# Patient Record
Sex: Male | Born: 1980 | Race: Black or African American | Hispanic: No | Marital: Single | Smoking: Never smoker
Health system: Southern US, Community
[De-identification: ages and names within clinical notes are randomized; demographics above are authoritative.]

## PROBLEM LIST (undated history)

## (undated) DIAGNOSIS — F32A Depression, unspecified: Secondary | ICD-10-CM

## (undated) DIAGNOSIS — J45909 Unspecified asthma, uncomplicated: Secondary | ICD-10-CM

## (undated) DIAGNOSIS — F329 Major depressive disorder, single episode, unspecified: Secondary | ICD-10-CM

---
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
## 2000-06-24 ENCOUNTER — Emergency Department (HOSPITAL_COMMUNITY): Admission: EM | Admit: 2000-06-24 | Discharge: 2000-06-24 | Payer: Self-pay

## 2001-06-09 ENCOUNTER — Emergency Department (HOSPITAL_COMMUNITY): Admission: EM | Admit: 2001-06-09 | Discharge: 2001-06-09 | Payer: Self-pay | Admitting: Emergency Medicine

## 2001-06-09 ENCOUNTER — Encounter: Payer: Self-pay | Admitting: Internal Medicine

## 2001-06-25 ENCOUNTER — Emergency Department (HOSPITAL_COMMUNITY): Admission: EM | Admit: 2001-06-25 | Discharge: 2001-06-25 | Payer: Self-pay | Admitting: Emergency Medicine

## 2001-10-03 ENCOUNTER — Encounter: Payer: Self-pay | Admitting: Emergency Medicine

## 2001-10-03 ENCOUNTER — Encounter (INDEPENDENT_AMBULATORY_CARE_PROVIDER_SITE_OTHER): Payer: Self-pay | Admitting: Specialist

## 2001-10-04 ENCOUNTER — Observation Stay (HOSPITAL_COMMUNITY): Admission: EM | Admit: 2001-10-04 | Discharge: 2001-10-04 | Payer: Self-pay | Admitting: Emergency Medicine

## 2006-05-18 ENCOUNTER — Encounter: Admission: RE | Admit: 2006-05-18 | Discharge: 2006-05-18 | Payer: Self-pay | Admitting: *Deleted

## 2006-07-04 ENCOUNTER — Emergency Department (HOSPITAL_COMMUNITY): Admission: EM | Admit: 2006-07-04 | Discharge: 2006-07-04 | Payer: Self-pay | Admitting: *Deleted

## 2006-10-20 ENCOUNTER — Emergency Department (HOSPITAL_COMMUNITY): Admission: EM | Admit: 2006-10-20 | Discharge: 2006-10-20 | Payer: Self-pay | Admitting: Emergency Medicine

## 2007-07-26 ENCOUNTER — Encounter: Admission: RE | Admit: 2007-07-26 | Discharge: 2007-08-15 | Payer: Self-pay | Admitting: Sports Medicine

## 2007-10-24 ENCOUNTER — Emergency Department (HOSPITAL_COMMUNITY): Admission: EM | Admit: 2007-10-24 | Discharge: 2007-10-24 | Payer: Self-pay | Admitting: Emergency Medicine

## 2008-02-22 ENCOUNTER — Encounter: Admission: RE | Admit: 2008-02-22 | Discharge: 2008-02-22 | Payer: Self-pay | Admitting: Orthopedic Surgery

## 2008-12-23 ENCOUNTER — Emergency Department (HOSPITAL_COMMUNITY): Admission: EM | Admit: 2008-12-23 | Discharge: 2008-12-23 | Payer: Self-pay | Admitting: Emergency Medicine

## 2009-04-05 ENCOUNTER — Emergency Department (HOSPITAL_COMMUNITY): Admission: EM | Admit: 2009-04-05 | Discharge: 2009-04-05 | Payer: Self-pay | Admitting: Emergency Medicine

## 2009-05-06 ENCOUNTER — Encounter: Payer: Self-pay | Admitting: Internal Medicine

## 2010-04-17 ENCOUNTER — Emergency Department (HOSPITAL_COMMUNITY): Admission: EM | Admit: 2010-04-17 | Discharge: 2010-04-17 | Payer: Self-pay | Admitting: Emergency Medicine

## 2011-03-15 LAB — GC/CHLAMYDIA PROBE AMP, GENITAL
Chlamydia, DNA Probe: NEGATIVE
GC Probe Amp, Genital: NEGATIVE

## 2011-03-15 LAB — HIV ANTIBODY (ROUTINE TESTING W REFLEX): HIV: NONREACTIVE

## 2011-03-15 LAB — RPR: RPR Ser Ql: NONREACTIVE

## 2011-04-16 NOTE — Op Note (Signed)
[facility]  Patient:    Brady Wells, Brady Wells Visit Number: [id number] MRN: [id number]          Service Type: MED Location: [phone] Attending Physician:  Meredith Leeds Dictated by:   Zigmund Daniel, M.D. Proc. Date: 10/04/01 Admit Date:  10/03/2001 Discharge Date: 10/04/2001                             Operative Report  PREOPERATIVE DIAGNOSIS:  Acute appendicitis.  POSTOPERATIVE DIAGNOSIS:  Acute appendicitis.  OPERATION:  Laparoscopic appendectomy.  SURGEON:  Zigmund Daniel, M.D.  ANESTHESIA:  General.  DESCRIPTION OF PROCEDURE:  After the patient had adequate monitoring and general anesthesia, and routine preparation and draping of the abdomen, I made a short transverse infraumbilical incision after anesthetizing the skin, subcutaneous tissue, and deeper tissues.  I opened the fascia longitudinally, and the peritoneum bluntly, and put in a 0 Vicryl pursestring suture in the fascia, secured a Hasson cannula, inflated the abdomen with CO2, and performed laparoscopy.  The patient had obvious acute appendicitis.  I put in a 5 mm right upper quadrant port and a 12 mm suprapubic port after anesthetizing the port sites, putting them in under direct vision to avoid bowel injury.  I then grasped the appendix and elevated it, and mobilized the mesoappendix.  Near the cecum, the appendix was healthy.  I stapled across the appendix and mesoappendix with a single firing of the endovascular stapler, and that produced good hemostasis, and amputated the appendix nicely.  I removed it from the body in a plastic pouch through the umbilical incision and then tied the pursestring suture.  I checked for hemostasis and found it to be good. There was no free fluid requiring removal.  I removed the right upper quadrant port under direct vision and then released the CO2 and removed the suprapubic port.  There was no evident bleeding.  I closed the skin  of all three incisions with intracuticular 4-0 Vicryl and Steri-Strips.  The patient tolerated the operation well. Dictated by:   Zigmund Daniel, M.D. Attending Physician:  Meredith Leeds DD:  10/04/01 TD:  10/05/01 Job: [id number]

## 2011-09-07 LAB — POCT RAPID STREP A: Streptococcus, Group A Screen (Direct): NEGATIVE

## 2011-09-07 LAB — POCT INFECTIOUS MONO SCREEN: Mono Screen: NEGATIVE

## 2011-12-10 ENCOUNTER — Ambulatory Visit (INDEPENDENT_AMBULATORY_CARE_PROVIDER_SITE_OTHER): Payer: BC Managed Care – PPO

## 2011-12-10 DIAGNOSIS — S63509A Unspecified sprain of unspecified wrist, initial encounter: Secondary | ICD-10-CM

## 2011-12-10 DIAGNOSIS — M79609 Pain in unspecified limb: Secondary | ICD-10-CM

## 2012-04-03 ENCOUNTER — Ambulatory Visit (INDEPENDENT_AMBULATORY_CARE_PROVIDER_SITE_OTHER): Payer: BC Managed Care – PPO | Admitting: Internal Medicine

## 2012-04-03 VITALS — BP 123/74 | HR 67 | Temp 98.4°F | Resp 16 | Ht 71.5 in | Wt 195.0 lb

## 2012-04-03 DIAGNOSIS — S8010XA Contusion of unspecified lower leg, initial encounter: Secondary | ICD-10-CM

## 2012-04-03 NOTE — Progress Notes (Signed)
  Subjective:    Patient ID: Brady Wells, male    DOB: [birth year], 31 y.o.   MRN: [id number]  HPI3 days ago he dropped the heavy end of a trailer and it slid down his leg striking him in the posterior calf-he has swelling with bruising and pain with walking ever since   Works 2 jobs one sitting and 1 [employer] so they sent him home from [employer] Review of Systems     Objective:   Physical ExamThe right calf has ecchymoses from the mid calf into the edge of the Achilles and gathered around the heel/he is now swollen and the lower one third of the calf No muscle defect Good range of motion of the ankle and foot No mass deep in the calf Negative Homans Able to walk without pain but hurts to tip toe        Assessment & Plan:  Problem #1 contusion leg Use ACE or compression stocking to help control swelling Increased angulation as tolerated

## 2012-05-08 ENCOUNTER — Emergency Department (INDEPENDENT_AMBULATORY_CARE_PROVIDER_SITE_OTHER)
Admission: EM | Admit: 2012-05-08 | Discharge: 2012-05-08 | Disposition: A | Discharge status: Home / Self Care | Payer: BC Managed Care – PPO | Source: Home / Self Care

## 2012-05-08 ENCOUNTER — Encounter (HOSPITAL_COMMUNITY): Payer: Self-pay

## 2012-05-08 DIAGNOSIS — J309 Allergic rhinitis, unspecified: Secondary | ICD-10-CM

## 2012-05-08 DIAGNOSIS — Z9109 Other allergy status, other than to drugs and biological substances: Secondary | ICD-10-CM

## 2012-05-08 MED ORDER — AMOXICILLIN 500 MG PO CAPS: 500.0000 mg | ORAL_CAPSULE | Freq: Three times a day (TID) | ORAL | Status: AC

## 2012-05-08 MED ORDER — CHLORPHENIRAMINE-PHENYLEPHRINE 4-10 MG PO TABS: 1.0000 | ORAL_TABLET | ORAL | Status: AC | PRN

## 2012-05-08 NOTE — ED Provider Notes (Signed)
History     CSN: [id number]  Arrival date & time 05/08/12  1115   First MD Initiated Contact with Patient 05/08/12 1201      No chief complaint on file.   (Consider location/radiation/quality/duration/timing/severity/associated sxs/prior treatment) HPI  Patient is 31 year old male who presents with progressively worsening right ear pain associated with nasal congestion, nasal drip, sinus tenderness, generalized headaches. This initially started one week prior to this visit, and has been getting progressively worse. Patient also reports subjective fevers and chills, no other associated symptoms. Patient also denies any specific aggravating or alleviating factors. He denies similar episodes in the past and reports history of extensive allergies to different environmentals factors. Patient also reports history of ear infections.  No past medical history on file.  No past surgical history on file.  No family history on file.  History  Substance Use Topics  . Smoking status: Never Smoker   . Smokeless tobacco: Not on file  . Alcohol Use: Not on file      Review of Systems  Constitutional: Denies diaphoresis, appetite change and fatigue.  HEENT: Denies photophobia, eye pain, redness, hearing loss, sore throat, mouth sores, trouble swallowing, neck pain, neck stiffness and tinnitus.   Respiratory: Denies SOB, DOE, cough, chest tightness,  and wheezing.   Cardiovascular: Denies chest pain, palpitations and leg swelling.  Gastrointestinal: Denies nausea, vomiting, abdominal pain, diarrhea, constipation, blood in stool and abdominal distention.  Genitourinary: Denies dysuria, urgency, frequency, hematuria, flank pain and difficulty urinating.  Musculoskeletal: Denies myalgias, back pain, joint swelling, arthralgias and gait problem.  Skin: Denies pallor, rash and wound.  Neurological: Denies dizziness, seizures, syncope, weakness, light-headedness, numbness and headaches.    Hematological: Denies adenopathy. Easy bruising, personal or family bleeding history  Psychiatric/Behavioral: Denies suicidal ideation, mood changes, confusion, nervousness, sleep disturbance and agitation   Allergies  Review of patient's allergies indicates no known allergies.  Home Medications  No current outpatient prescriptions on file.  There were no vitals taken for this visit.  Physical Exam  Constitutional: Vital signs reviewed.  Patient is a well-developed and well-nourished in no acute distress and cooperative with exam. Alert and oriented x3.  Head: Normocephalic and atraumatic, maxillary and frontal sinus tenderness bilaterally Ear: TM normal left, right ear tympanic membrane erythematous and slightly swollen, small amount of blood noted Mouth: no erythema or exudates, MMM Eyes: PERRL, EOMI, conjunctivae normal, No scleral icterus.  Neck: Supple, Trachea midline normal ROM, No JVD, mass, thyromegaly, or carotid bruit present.  Cardiovascular: RRR, S1 normal, S2 normal, no MRG, pulses symmetric and intact bilaterally Pulmonary/Chest: CTAB, no wheezes, rales, or rhonchi Abdominal: Soft. Non-tender, non-distended, bowel sounds are normal, no masses, organomegaly, or guarding present.  Hematology: no cervical, inginal, or axillary adenopathy.   ED Course  Procedures (including critical care time)   Right ear infection  - Complicated by environmental allergy - Will prescribe course of antibiotic for patient and I have advised avoiding use of Q-tips - I also advised patient to see primary care physician if the symptoms do not improve over the next 48-72 hours   MDM  Right ear infection and allergies, needs antibiotic        Dorothea Ogle, MD 05/08/12 1253

## 2012-05-08 NOTE — Discharge Instructions (Signed)
Allergic Reaction  Allergic reactions can be caused by anything your body is sensitive to. Your body may be sensitive to food, medicines, molds, pollens, cockroaches, dust mites, pets, insect stings, and other things around you. An allergic reaction may cause puffiness (swelling), itching, sneezing, coughing, or problems breathing.   Allergies cannot be cured, but they can be controlled with medicine. Some allergies happen only at certain times of the year. Try to stay away from what causes your reaction if possible. Sometimes, it is hard to tell what causes your reaction.  HOME CARE  If you have a rash or red patches (hives) on your skin:   Take medicines as told by your doctor.   Do not drive or drink alcohol after taking medicines. They can make you sleepy.   Put cold cloths on your skin. Take baths in cool water. This will help your itching. Do not take hot baths or showers. Heat will make the itching worse.   If your allergies get worse, your doctor might give you other medicines. Talk to your doctor if problems continue.  GET HELP RIGHT AWAY IF:    You have trouble breathing.   You have a tight feeling in your chest or throat.   Your mouth gets puffy (swollen).   You have red, itchy patches on your skin (hives) that get worse.   You have itching all over your body.  MAKE SURE YOU:    Understand these instructions.   Will watch your condition.   Will get help right away if you are not doing well or get worse.  Document Released: 11/03/2009 Document Revised: 11/04/2011 Document Reviewed: 11/03/2009  [employer] Patient Information 2012 [employer]

## 2012-05-08 NOTE — ED Notes (Signed)
C/o allergy syx; NAD

## 2012-05-16 ENCOUNTER — Telehealth (HOSPITAL_COMMUNITY): Payer: Self-pay | Admitting: *Deleted

## 2012-05-16 NOTE — ED Notes (Signed)
Pt. called and said the antibiotic was making him sleepy and he could not function at work.  He was going to stop it. I told him he needs to finish the antibiotic to clear up the ear infection.  There is nothing in it that would make him sleepy.  It may be the Sudafed PE sinus/allergy.  I told him take it when he gets home from work and at bedtime, not before work.  Pt. said he would try that. Vassie Moselle 05/16/2012

## 2012-05-21 ENCOUNTER — Ambulatory Visit (INDEPENDENT_AMBULATORY_CARE_PROVIDER_SITE_OTHER): Payer: BC Managed Care – PPO | Admitting: Emergency Medicine

## 2012-05-21 VITALS — BP 115/72 | HR 71 | Temp 98.1°F | Resp 18 | Ht 71.5 in | Wt 186.0 lb

## 2012-05-21 DIAGNOSIS — J029 Acute pharyngitis, unspecified: Secondary | ICD-10-CM

## 2012-05-21 DIAGNOSIS — H1030 Unspecified acute conjunctivitis, unspecified eye: Secondary | ICD-10-CM

## 2012-05-21 DIAGNOSIS — J018 Other acute sinusitis: Secondary | ICD-10-CM

## 2012-05-21 DIAGNOSIS — J309 Allergic rhinitis, unspecified: Secondary | ICD-10-CM

## 2012-05-21 MED ORDER — AMOXICILLIN-POT CLAVULANATE 875-125 MG PO TABS: 1.0000 | ORAL_TABLET | Freq: Two times a day (BID) | ORAL | Status: AC

## 2012-05-21 MED ORDER — PSEUDOEPHEDRINE-GUAIFENESIN ER 120-1200 MG PO TB12: 1.0000 | ORAL_TABLET | Freq: Two times a day (BID) | ORAL | Status: DC

## 2012-05-21 NOTE — Progress Notes (Signed)
    Patient Name: Brady Wells Date of Birth: 1981-09-01 Medical Record Number: [id number] Gender: male Date of Encounter: 05/21/2012  History of Present Illness:  Brady Wells is a 31 y.o. very pleasant male patient who presents with the following:  Long history of allergy and sinusitis.  Seen twice before this week for same.  Has purulent discharge, nasal congestion and post nasal drip.  Bad taste in mouth.  Cough.  Not improving with nasal steroid sprays  There is no problem list on file for this patient.  No past medical history on file. No past surgical history on file. History  Substance Use Topics  . Smoking status: Never Smoker   . Smokeless tobacco: Not on file  . Alcohol Use: Not on file   No family history on file. No Known Allergies  Medication list has been reviewed and updated.  Prior to Admission medications   Medication Sig Start Date End Date Taking? Authorizing Provider  loratadine (CLARITIN) 10 MG tablet Take 10 mg by mouth daily.   Yes Historical Provider, MD  amoxicillin-clavulanate (AUGMENTIN) 875-125 MG per tablet Take 1 tablet by mouth 2 (two) times daily. 05/21/12 05/31/12  Phillips Odor, MD  Pseudoephedrine-Guaifenesin (MUCINEX D) [phone] MG TB12 Take 1 tablet by mouth 2 (two) times daily. 05/21/12   Phillips Odor, MD    Review of Systems:  As per HPI, otherwise negative.    Physical Examination: Filed Vitals:   05/21/12 1103  BP: 115/72  Pulse: 71  Temp: 98.1 F (36.7 C)  Resp: 18   Filed Vitals:   05/21/12 1103  Height: 5' 11.5" (1.816 m)  Weight: 186 lb (84.369 kg)   Body mass index is 25.58 kg/(m^2). Ideal Body Weight: Weight in (lb) to have BMI = 25: 181.4   GEN: WDWN, NAD, Non-toxic, A & O x 3 HEENT: Atraumatic, Normocephalic. Neck supple. No masses, No LAD.  Purulent drainage right eye Ears and Nose: No external deformity. CV: RRR, No M/G/R. No JVD. No thrill. No extra heart sounds. PULM: CTA B, no wheezes,  crackles, rhonchi. No retractions. No resp. distress. No accessory muscle use. ABD: S, NT, ND, +BS. No rebound. No HSM. EXTR: No c/c/e NEURO Normal gait.  PSYCH: Normally interactive. Conversant. Not depressed or anxious appearing.  Calm demeanor.    Assessment and Plan: Sinusitis Antibiotics Follow up as needed  Carmelina Dane, MD

## 2013-06-10 ENCOUNTER — Encounter (HOSPITAL_COMMUNITY): Payer: Self-pay | Admitting: *Deleted

## 2013-06-10 ENCOUNTER — Emergency Department (HOSPITAL_COMMUNITY): Payer: BC Managed Care – PPO

## 2013-06-10 ENCOUNTER — Emergency Department (HOSPITAL_COMMUNITY)
Admission: EM | Admit: 2013-06-10 | Discharge: 2013-06-11 | Disposition: A | Discharge status: Home / Self Care | Payer: BC Managed Care – PPO | Attending: Emergency Medicine | Admitting: Emergency Medicine

## 2013-06-10 DIAGNOSIS — Z79899 Other long term (current) drug therapy: Secondary | ICD-10-CM | POA: Insufficient documentation

## 2013-06-10 DIAGNOSIS — R55 Syncope and collapse: Secondary | ICD-10-CM | POA: Insufficient documentation

## 2013-06-10 DIAGNOSIS — F329 Major depressive disorder, single episode, unspecified: Secondary | ICD-10-CM | POA: Insufficient documentation

## 2013-06-10 DIAGNOSIS — F3289 Other specified depressive episodes: Secondary | ICD-10-CM | POA: Insufficient documentation

## 2013-06-10 DIAGNOSIS — G4720 Circadian rhythm sleep disorder, unspecified type: Secondary | ICD-10-CM | POA: Diagnosis present

## 2013-06-10 DIAGNOSIS — J45909 Unspecified asthma, uncomplicated: Secondary | ICD-10-CM | POA: Insufficient documentation

## 2013-06-10 HISTORY — DX: Unspecified asthma, uncomplicated: J45.909

## 2013-06-10 HISTORY — DX: Depression, unspecified: F32.A

## 2013-06-10 HISTORY — DX: Major depressive disorder, single episode, unspecified: F32.9

## 2013-06-10 LAB — CBC
HCT: 43.9 % (ref 39.0–52.0)
Hemoglobin: 14.5 g/dL (ref 13.0–17.0)
MCH: 25.3 pg — ABNORMAL LOW (ref 26.0–34.0)
MCHC: 33 g/dL (ref 30.0–36.0)
MCV: 76.6 fL — ABNORMAL LOW (ref 78.0–100.0)
Platelets: 274 10*3/uL (ref 150–400)
RBC: 5.73 MIL/uL (ref 4.22–5.81)
RDW: 13.9 % (ref 11.5–15.5)
WBC: 7.3 10*3/uL (ref 4.0–10.5)

## 2013-06-10 LAB — BASIC METABOLIC PANEL
BUN: 16 mg/dL (ref 6–23)
CO2: 27 mEq/L (ref 19–32)
Calcium: 9.4 mg/dL (ref 8.4–10.5)
Chloride: 103 mEq/L (ref 96–112)
Creatinine, Ser: 1.03 mg/dL (ref 0.50–1.35)
GFR calc Af Amer: >90 mL/min (ref >90)
GFR calc non Af Amer: >90 mL/min (ref >90)
Glucose, Bld: 129 mg/dL — ABNORMAL HIGH (ref 70–99)
Potassium: 4.2 mEq/L (ref 3.5–5.1)
Sodium: 139 mEq/L (ref 135–145)

## 2013-06-10 LAB — POCT I-STAT TROPONIN I: Troponin i, poc: 0.01 ng/mL (ref 0.00–0.08)

## 2013-06-10 LAB — ETHANOL: Alcohol, Ethyl (B): <11 mg/dL (ref 0–11)

## 2013-06-10 MED ORDER — LORAZEPAM 2 MG/ML IJ SOLN
1.0000 mg | Freq: Once | INTRAMUSCULAR | Status: AC
  Administered 2013-06-10: 1 mg via INTRAVENOUS
  Filled 2013-06-10: qty 1

## 2013-06-10 MED ORDER — LORAZEPAM 1 MG PO TABS: 1.0000 mg | ORAL_TABLET | Freq: Three times a day (TID) | ORAL | Status: DC | PRN

## 2013-06-10 MED ORDER — ALBUTEROL SULFATE HFA 108 (90 BASE) MCG/ACT IN AERS: 2.0000 | INHALATION_SPRAY | Freq: Four times a day (QID) | RESPIRATORY_TRACT | Status: DC | PRN

## 2013-06-10 MED ORDER — SERTRALINE HCL 50 MG PO TABS: 25.0000 mg | ORAL_TABLET | Freq: Every day | ORAL | Status: DC

## 2013-06-10 MED ORDER — ADULT MULTIVITAMIN W/MINERALS CH: 1.0000 | ORAL_TABLET | Freq: Every day | ORAL | Status: DC

## 2013-06-10 NOTE — ED Provider Notes (Addendum)
History    CSN: [id number] Arrival date & time 06/10/13  2107  First MD Initiated Contact with Patient 06/10/13 2125     Chief Complaint  Patient presents with  . Loss of Consciousness   HPI  Patient presents with multiple concerns. History of present illness is per the patient and family members. The patient himself states that he recalls nothing of the day for the past 12 hours.  On he recalls going to church, and is amnestic to the events subsequent to that until just prior to arrival, when he was at home, felt an episode of generalized discomfort, was again amnestic to a time period, and on recovery was again back to his usual status. According to the patient's mother the patient had several notable event during the period that the patient does not recall. Primarily, the patient was witnessed to be making cutting gestures of his wrists, while stating that he was tired of this world.  Subsequently, the patient was witnessed to be driving erratically, weaving in and out of traffic. The patient may have endorse additional suicidal thoughts to other individuals, but these people are not here currently. On my exam the patient denies any suicidal thoughts. He does endorse a history of emotional lability, for which he was recently prescribed Zoloft.  He has taken several doses of this medication in the past few days. He has no history of psychiatric hospitalization, nor other thorough psychiatric evaluation. On exam the patient also denies any ongoing pain.   Past Medical History  Diagnosis Date  . Depression   . Asthma    No past surgical history on file. No family history on file. History  Substance Use Topics  . Smoking status: Never Smoker   . Smokeless tobacco: Not on file  . Alcohol Use: Not on file    Review of Systems  Constitutional:       Per HPI, otherwise negative  HENT:       Per HPI, otherwise negative  Respiratory:       Per HPI, otherwise negative   Cardiovascular:       Per HPI, otherwise negative  Gastrointestinal: Negative for nausea and vomiting.  Endocrine:       Negative aside from HPI  Genitourinary:       Neg aside from HPI   Musculoskeletal:       Per HPI, otherwise negative  Skin: Negative.   Neurological: Negative for syncope, weakness and numbness.    Allergies  Bee venom  Home Medications   Current Outpatient Rx  Name  Route  Sig  Dispense  Refill  . albuterol (PROVENTIL HFA;VENTOLIN HFA) 108 (90 BASE) MCG/ACT inhaler   Inhalation   Inhale 2 puffs into the lungs every 6 (six) hours as needed for wheezing.         . Multiple Vitamin (MULTIVITAMIN WITH MINERALS) TABS   Oral   Take 1 tablet by mouth daily.         . sertraline (ZOLOFT) 50 MG tablet   Oral   Take 25 mg by mouth daily.          BP 129/63  Pulse 64  Temp(Src) 98.2 F (36.8 C) (Oral)  Resp 8  SpO2 100% Physical Exam  Nursing note and vitals reviewed. Constitutional: He is oriented to person, place, and time. He appears well-developed. No distress.  HENT:  Head: Normocephalic and atraumatic.  Eyes: Conjunctivae and EOM are normal.  Cardiovascular: Normal rate and regular rhythm.  Pulmonary/Chest: Effort normal. No stridor. No respiratory distress.  Abdominal: He exhibits no distension.  Musculoskeletal: He exhibits no edema.  Neurological: He is alert and oriented to person, place, and time.  Skin: Skin is warm and dry.  Psychiatric: He has a normal mood and affect. His speech is normal and behavior is normal. Judgment normal. Thought content is not paranoid and not delusional. Cognition and memory are normal. He expresses no homicidal and no suicidal ideation. He expresses no suicidal plans and no homicidal plans.    ED Course  Procedures (including critical care time) Labs Reviewed  CBC - Abnormal; Notable for the following:    MCV 76.6 (*)    MCH 25.3 (*)    All other components within normal limits  BASIC METABOLIC  PANEL - Abnormal; Notable for the following:    Glucose, Bld 129 (*)    All other components within normal limits  URINE RAPID DRUG SCREEN (HOSP PERFORMED)  ETHANOL  URINALYSIS, ROUTINE W REFLEX MICROSCOPIC  POCT I-STAT TROPONIN I    Cardiac 60 sinus rhythm normal Pulse ox 100% room air normal ECG has sinus rhythm rate 60, first degree AV block, nonspecific ST elevations consistent with early repolarization.  This is abnormal and there but unchanged.   Dg Chest 2 View  06/10/2013   *RADIOLOGY REPORT*  Clinical Data: Shortness of breath.  CHEST - 2 VIEW  Comparison: None.  Findings: No significant osseous abnormality.  Lungs are clear. No effusion or pneumothorax.  Cardiomediastinal size and contour are within normal limits.  The upper abdomen is unremarkable.  IMPRESSION: No evidence of acute cardiopulmonary disease.   Original Report Authenticated By: Tiburcio Pea   Ct Head Wo Contrast  06/10/2013   *RADIOLOGY REPORT*  Clinical Data: Dizziness; altered mental status.  Syncope.  CT HEAD WITHOUT CONTRAST  Technique:  Contiguous axial images were obtained from the base of the skull through the vertex without contrast.  Comparison: None.  Findings: There is no evidence of acute infarction, mass lesion, or intra- or extra-axial hemorrhage on CT.  The posterior fossa, including the cerebellum, brainstem and fourth ventricle, is within normal limits.  The third and lateral ventricles, and basal ganglia are unremarkable in appearance.  The cerebral hemispheres are symmetric in appearance, with normal gray- white differentiation.  No mass effect or midline shift is seen.  There is no evidence of fracture; visualized osseous structures are unremarkable in appearance.  The visualized portions of the orbits are within normal limits.  The paranasal sinuses and mastoid air cells are well-aerated.  No significant soft tissue abnormalities are seen.  IMPRESSION: Unremarkable noncontrast CT of the head.    Original Report Authenticated By: Tonia Ghent, M.D.   No diagnosis found. Update: I was called to bedside as the patient had an episode of absence seizure-like activity. On exam the patient's airway is patent.  Vital signs are stable.  Patient does not respond to voice or anything.  However, lifting the patient's arm above his head results in the patient moving the arm to his side rather than striking his forehead.  Subsequently the patient had another episode while I was present.  This event occurred as the patient's girlfriend was describing what she thought that occurred today.  The patient went into a trance like phase, staring at her, with fasciculations of the lower jaw.  Patient was nonresponsive.  Again, this episode stopped without clear intervention, and the patient's and returned to baseline cognitive state. MDM  This young  male, ostensibly healthy, now presents with episodes of altered mental status, amnesia, and concerns for endorsement of suicidal thoughts, as well as behavior that is dangerous to both him and others. The patient does have episodic periods of not interactivity, but these seem less consistent with seizure, than with psychogenic etiology.  Patient's evaluation here including head CT, chest x-ray, labs is largely reassuring.  I discussed the patient's case with our behavioral health team, the patient is medically clear for evaluation.  Considerations include: depression unmasked by the recent initiation of sertraline.  Additionally, the patient may be having psychogenic stress episodes. Or (atypical) psychotic break  Gerhard Munch, MD 06/10/13 2351  Gerhard Munch, MD 06/10/13 [phone]

## 2013-06-10 NOTE — ED Notes (Signed)
Called into patients room due to patient being unresponsive to phlebotomist, upon entering room, pt was sitting up with respirations even and nonlabored, eye open, does not respond to verbal stimuli, painful stimuli applied through multiple techniques with no response from patient, MD Jeraldine Loots called to bedside, pt VS WNL, pt began to respond to verbal stimuli, answers questions inappropriately, pt then became unresponsive again with eyes open, respirations remain unlabored, MD remains at bedside, pt with gaze focused on girlfriend, his eyes and head follow her movement, pt continues to not respond verbally to any stimuli, orders given for IV ativan by MD Jeraldine Loots at bedside, obtained and administered, upon return to room to administer medication pt began to respond verbally, comments are appropriate, asking where he is and states he does not remember the last few minutes.

## 2013-06-10 NOTE — ED Notes (Signed)
[id number]<AV> Expected date:<BR> Expected time:<BR> Means of arrival:<BR> Comments:<BR> Bed 1,

## 2013-06-10 NOTE — ED Notes (Signed)
Pt in via EMS, per EMS- pt in after syncopal episode, upon EMS arrival pt was alert but not following commands, skin was pale and diaphoretic, hypotensive at 90/60, CBG 113, HR 60 on monitor, given 250 NS bolus, skin warm and dry at this time, pt alert and oriented at this time and states he feels better, pt denies pain at this time, no distress noted

## 2013-06-11 DIAGNOSIS — F329 Major depressive disorder, single episode, unspecified: Secondary | ICD-10-CM

## 2013-06-11 DIAGNOSIS — G4720 Circadian rhythm sleep disorder, unspecified type: Secondary | ICD-10-CM | POA: Diagnosis present

## 2013-06-11 LAB — URINALYSIS, ROUTINE W REFLEX MICROSCOPIC
Bilirubin Urine: NEGATIVE
Glucose, UA: NEGATIVE mg/dL
Hgb urine dipstick: NEGATIVE
Ketones, ur: NEGATIVE mg/dL
Leukocytes, UA: NEGATIVE
Nitrite: NEGATIVE
Protein, ur: NEGATIVE mg/dL
Specific Gravity, Urine: 1.025 (ref 1.005–1.030)
Urobilinogen, UA: 0.2 mg/dL (ref 0.0–1.0)
pH: 7.5 (ref 5.0–8.0)

## 2013-06-11 LAB — RAPID URINE DRUG SCREEN, HOSP PERFORMED
Amphetamines: NOT DETECTED
Barbiturates: NOT DETECTED
Benzodiazepines: NOT DETECTED
Cocaine: NOT DETECTED
Opiates: NOT DETECTED
Tetrahydrocannabinol: NOT DETECTED

## 2013-06-11 NOTE — Progress Notes (Signed)
[facility] MD Progress Note  06/11/2013 2:32 AM Brady Wells  MRN:  [id number] Subjective: ABNORMAL PSYCHMOTOR ACTIVITY IN ED  Diagnosis:  Axis I: NO DIAGNOSIS  ADL's:  Intact  Sleep: Poor  Appetite:  Good  Suicidal Ideation:  Plan:  NONE Homicidal Ideation:  Plan:  NONE AEB (as evidenced by):  Psychiatric Specialty Exam: Review of Systems  Constitutional: Negative.   HENT: Negative.   Eyes: Negative.   Cardiovascular: Negative.   Gastrointestinal: Negative.   Genitourinary: Negative.   Musculoskeletal: Negative.   Skin: Negative.   Neurological: Positive for seizures (Seizure like activity reported-).  Endo/Heme/Allergies: Negative.   Psychiatric/Behavioral: Negative for depression, suicidal ideas, hallucinations, memory loss and substance abuse. The patient has insomnia (Pt has had significant sleep disturbance with alternating day /nite shifts for past 1-1 1/2 yrs at [employer]). The patient is not nervous/anxious.     Blood pressure 118/59, pulse 64, temperature 98.2 F (36.8 C), temperature source Oral, resp. rate 14, SpO2 100.00%.There is no weight on file to calculate BMI.  General Appearance: fatigued  Eye Contact::  Good  Speech:  Clear and Coherent  Volume:  Normal  Mood:  Euthymic  Affect:  Congruent  Thought Process:  Intact  Orientation:  Full (Time, Place, and Person)  Thought Content:  WDL  Suicidal Thoughts:  No  Homicidal Thoughts:  No  Memory:  Negative  Judgement:  Good  Insight:  Good  Psychomotor Activity:  Normal  Concentration:  Fair  Recall:  Good  Akathisia:  NA  Handed:  Right  AIMS (if indicated):     Assets:  Social Support  Sleep:      Current Medications: Current Facility-Administered Medications  Medication Dose Route Frequency Provider Last Rate Last Dose  . albuterol (PROVENTIL HFA;VENTOLIN HFA) 108 (90 BASE) MCG/ACT inhaler 2 puff  2 puff Inhalation Q6H PRN Gerhard Munch, MD      . LORazepam (ATIVAN) tablet 1 mg  1 mg Oral Q8H PRN  Gerhard Munch, MD      . multivitamin with minerals tablet 1 tablet  1 tablet Oral Daily Gerhard Munch, MD      . sertraline (ZOLOFT) tablet 25 mg  25 mg Oral Daily Gerhard Munch, MD       Current Outpatient Prescriptions  Medication Sig Dispense Refill  . albuterol (PROVENTIL HFA;VENTOLIN HFA) 108 (90 BASE) MCG/ACT inhaler Inhale 2 puffs into the lungs every 6 (six) hours as needed for wheezing.      . Multiple Vitamin (MULTIVITAMIN WITH MINERALS) TABS Take 1 tablet by mouth daily.      . sertraline (ZOLOFT) 50 MG tablet Take 25 mg by mouth daily.        Lab Results:  Results for orders placed during the hospital encounter of 06/10/13 (from the past 48 hour(s))  CBC     Status: Abnormal   Collection Time    06/10/13  9:35 PM      Result Value Range   WBC 7.3  4.0 - 10.5 K/uL   RBC 5.73  4.22 - 5.81 MIL/uL   Hemoglobin 14.5  13.0 - 17.0 g/dL   HCT 69.6  29.5 - 28.4 %   MCV 76.6 (*) 78.0 - 100.0 fL   MCH 25.3 (*) 26.0 - 34.0 pg   MCHC 33.0  30.0 - 36.0 g/dL   RDW 13.2  44.0 - 10.2 %   Platelets 274  150 - 400 K/uL  BASIC METABOLIC PANEL     Status: Abnormal  Collection Time    06/10/13  9:35 PM      Result Value Range   Sodium 139  135 - 145 mEq/L   Potassium 4.2  3.5 - 5.1 mEq/L   Chloride 103  96 - 112 mEq/L   CO2 27  19 - 32 mEq/L   Glucose, Bld 129 (*) 70 - 99 mg/dL   BUN 16  6 - 23 mg/dL   Creatinine, Ser 4.40  0.50 - 1.35 mg/dL   Calcium 9.4  8.4 - 10.2 mg/dL   GFR calc non Af Amer >90  >90 mL/min   GFR calc Af Amer >90  >90 mL/min   Comment:            The eGFR has been calculated     using the CKD EPI equation.     This calculation has not been     validated in all clinical     situations.     eGFR's persistently     <90 mL/min signify     possible Chronic Kidney Disease.  POCT I-STAT TROPONIN I     Status: None   Collection Time    06/10/13  9:36 PM      Result Value Range   Troponin i, poc 0.01  0.00 - 0.08 ng/mL   Comment 3            Comment:  Due to the release kinetics of cTnI,     a negative result within the first hours     of the onset of symptoms does not rule out     myocardial infarction with certainty.     If myocardial infarction is still suspected,     repeat the test at appropriate intervals.  ETHANOL     Status: None   Collection Time    06/10/13 10:48 PM      Result Value Range   Alcohol, Ethyl (B) <11  0 - 11 mg/dL   Comment:            LOWEST DETECTABLE LIMIT FOR     SERUM ALCOHOL IS 11 mg/dL     FOR MEDICAL PURPOSES ONLY  URINE RAPID DRUG SCREEN (HOSP PERFORMED)     Status: None   Collection Time    06/10/13 11:51 PM      Result Value Range   Opiates NONE DETECTED  NONE DETECTED   Cocaine NONE DETECTED  NONE DETECTED   Benzodiazepines NONE DETECTED  NONE DETECTED   Amphetamines NONE DETECTED  NONE DETECTED   Tetrahydrocannabinol NONE DETECTED  NONE DETECTED   Barbiturates NONE DETECTED  NONE DETECTED   Comment:            DRUG SCREEN FOR MEDICAL PURPOSES     ONLY.  IF CONFIRMATION IS NEEDED     FOR ANY PURPOSE, NOTIFY LAB     WITHIN 5 DAYS.                LOWEST DETECTABLE LIMITS     FOR URINE DRUG SCREEN     Drug Class       Cutoff (ng/mL)     Amphetamine      1000     Barbiturate      200     Benzodiazepine   200     Tricyclics       300     Opiates          300     Cocaine  300     THC              50  URINALYSIS, ROUTINE W REFLEX MICROSCOPIC     Status: None   Collection Time    06/10/13 11:51 PM      Result Value Range   Color, Urine YELLOW  YELLOW   APPearance CLEAR  CLEAR   Specific Gravity, Urine 1.025  1.005 - 1.030   pH 7.5  5.0 - 8.0   Glucose, UA NEGATIVE  NEGATIVE mg/dL   Hgb urine dipstick NEGATIVE  NEGATIVE   Bilirubin Urine NEGATIVE  NEGATIVE   Ketones, ur NEGATIVE  NEGATIVE mg/dL   Protein, ur NEGATIVE  NEGATIVE mg/dL   Urobilinogen, UA 0.2  0.0 - 1.0 mg/dL   Nitrite NEGATIVE  NEGATIVE   Leukocytes, UA NEGATIVE  NEGATIVE   Comment: MICROSCOPIC NOT DONE ON  URINES WITH NEGATIVE PROTEIN, BLOOD, LEUKOCYTES, NITRITE, OR GLUCOSE <1000 mg/dL.    Physical Findings: AIMS:  , ,  ,  ,    CIWA:    COWS:     Treatment Plan Summary: recommend MRI of Brain /EEG studies and if negative Sleep Study  Plan:Per recommendations above  Medical Decision Making Problem Points:  New problem, with additional work-up planned (4) Data Points:  Review and summation of old records (2)  I certify that inpatient services furnished can reasonably be expected to improve the patient's condition.   Maryjean Morn E 06/11/2013, 2:32 AM
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
## 2013-06-11 NOTE — Progress Notes (Signed)
Patient ID: Brady Wells, male   DOB: [birth year], 32 y.o.   MRN: [id number] Pt given RX for MRI brain-results to Dr Cassandria Anger take to Xray Dept of choice/where insurance is accepted Also given # for [facility] to call for appt with referral from Dr Lucianne Muss or pt may FU with PCP and get referrals from there

## 2013-10-04 ENCOUNTER — Other Ambulatory Visit: Payer: Self-pay

## 2014-09-10 ENCOUNTER — Encounter: Payer: Self-pay | Admitting: Podiatry

## 2014-09-10 ENCOUNTER — Ambulatory Visit (INDEPENDENT_AMBULATORY_CARE_PROVIDER_SITE_OTHER): Payer: BC Managed Care – PPO | Admitting: Podiatry

## 2014-09-10 VITALS — BP 130/62 | HR 65 | Ht 72.0 in | Wt 220.0 lb

## 2014-09-10 DIAGNOSIS — M722 Plantar fascial fibromatosis: Secondary | ICD-10-CM | POA: Insufficient documentation

## 2014-09-10 DIAGNOSIS — M21969 Unspecified acquired deformity of unspecified lower leg: Secondary | ICD-10-CM | POA: Insufficient documentation

## 2014-09-10 DIAGNOSIS — M216X9 Other acquired deformities of unspecified foot: Secondary | ICD-10-CM | POA: Insufficient documentation

## 2014-09-10 DIAGNOSIS — M79606 Pain in leg, unspecified: Secondary | ICD-10-CM

## 2014-09-10 DIAGNOSIS — B351 Tinea unguium: Secondary | ICD-10-CM | POA: Insufficient documentation

## 2014-09-10 NOTE — Progress Notes (Signed)
Subjective: 33 year old male presents complaining of left heel pain x about a month. Hurts when getting out of bed first thing in the morning and later after been on feet for several hours. On feet at work about off and on, but not too much. He is a long distant truck driver. Does running 4-10 miles at a time 4-6 days per week. Stated that after he ran last marathon all his toe nails on lesser digits came off.   Review of Systems - General ROS: negative for - chills, fatigue, fever, night sweats or sleep disturbance Ophthalmic ROS: negative ENT ROS: negative Allergy and Immunology ROS: Pollens Respiratory ROS: no cough, shortness of breath, or wheezing Cardiovascular ROS: no chest pain or dyspnea on exertion Gastrointestinal ROS: no abdominal pain, change in bowel habits, or black or bloody stools Genito-Urinary ROS: no dysuria, trouble voiding, or hematuria Musculoskeletal ROS: negative Neurological ROS: no TIA or stroke symptoms Dermatological ROS: Dry peeling skin.  Objective: Dermatologic: Dry peeling skin.  Thick dystrophic nails x 10.  Neurologic: All epicritic and tactile sensations grossly intact. Vascular: All pedal pulses are palpable. No edema or erythema noted.  Orthopedic: Pain under the left heel pad in center.  Tight Achilles tendon both feet R>L, forefoot varus, elevated first ray with mild bunion bilateral. STJ hyperpronation bilateral.   Assessment: Plantar fasciitis left. Onychomycosis x 10. Tinea pedis both feet. Metatarsus primus elevatus bilateral. Ankle equinus bilateral. Pain in left foot.  Plan:  Reviewed clinical findings and biomechanics of foot. Reviewed available treatment options. Patient is to do daily stretch exercise. Exercise demonstrated. Patient is to scrub both feet after each shower with dandruff shampoo. All nails debrided. May benefit by using Metatarsal binder while running. Metatarsal binder dispensed x 1 pair medium. May benefit by  using Night Splint for morning heel pain. Dispensed x 1.  May benefit by using custom orthotics while on feet. Patient will return after receiving coverage information.

## 2014-09-10 NOTE — Patient Instructions (Signed)
Seen for left heel pain. Noted of tight heel cord, elevated first Metatarsal, and fungus in plantar surface of both feet.  All nails debrided. Do daily stretch exercise, use dandruff shampoo and scotch pad to scrub feet at the end of each shower. Use Night Splint each night, use Metatarsal binder while running. Return for custom orthotics.

## 2015-09-26 ENCOUNTER — Emergency Department (HOSPITAL_COMMUNITY): Payer: BLUE CROSS/BLUE SHIELD

## 2015-09-26 ENCOUNTER — Emergency Department (HOSPITAL_COMMUNITY)
Admission: EM | Admit: 2015-09-26 | Discharge: 2015-09-27 | Disposition: A | Discharge status: Home / Self Care | Payer: BLUE CROSS/BLUE SHIELD | Attending: Physician Assistant | Admitting: Physician Assistant

## 2015-09-26 ENCOUNTER — Encounter (HOSPITAL_COMMUNITY): Payer: Self-pay | Admitting: Emergency Medicine

## 2015-09-26 DIAGNOSIS — Z791 Long term (current) use of non-steroidal anti-inflammatories (NSAID): Secondary | ICD-10-CM | POA: Diagnosis not present

## 2015-09-26 DIAGNOSIS — F329 Major depressive disorder, single episode, unspecified: Secondary | ICD-10-CM | POA: Insufficient documentation

## 2015-09-26 DIAGNOSIS — F10120 Alcohol abuse with intoxication, uncomplicated: Secondary | ICD-10-CM | POA: Diagnosis not present

## 2015-09-26 DIAGNOSIS — F10129 Alcohol abuse with intoxication, unspecified: Secondary | ICD-10-CM | POA: Diagnosis present

## 2015-09-26 DIAGNOSIS — Z79899 Other long term (current) drug therapy: Secondary | ICD-10-CM | POA: Diagnosis not present

## 2015-09-26 DIAGNOSIS — J45909 Unspecified asthma, uncomplicated: Secondary | ICD-10-CM | POA: Insufficient documentation

## 2015-09-26 DIAGNOSIS — F10920 Alcohol use, unspecified with intoxication, uncomplicated: Secondary | ICD-10-CM

## 2015-09-26 LAB — CBC WITH DIFFERENTIAL/PLATELET
Basophils Absolute: 0 10*3/uL (ref 0.0–0.1)
Basophils Relative: 0 %
Eosinophils Absolute: 0 10*3/uL (ref 0.0–0.7)
Eosinophils Relative: 0 %
HCT: 45.5 % (ref 39.0–52.0)
Hemoglobin: 15.6 g/dL (ref 13.0–17.0)
Lymphocytes Relative: 20 %
Lymphs Abs: 1 10*3/uL (ref 0.7–4.0)
MCH: 26.6 pg (ref 26.0–34.0)
MCHC: 34.3 g/dL (ref 30.0–36.0)
MCV: 77.6 fL — ABNORMAL LOW (ref 78.0–100.0)
Monocytes Absolute: 0.1 10*3/uL (ref 0.1–1.0)
Monocytes Relative: 3 %
Neutro Abs: 3.9 10*3/uL (ref 1.7–7.7)
Neutrophils Relative %: 77 %
Platelets: 289 10*3/uL (ref 150–400)
RBC: 5.86 MIL/uL — ABNORMAL HIGH (ref 4.22–5.81)
RDW: 13.6 % (ref 11.5–15.5)
WBC: 5.1 10*3/uL (ref 4.0–10.5)

## 2015-09-26 LAB — COMPREHENSIVE METABOLIC PANEL
ALT: 39 U/L (ref 17–63)
AST: 46 U/L — ABNORMAL HIGH (ref 15–41)
Albumin: 4.5 g/dL (ref 3.5–5.0)
Alkaline Phosphatase: 50 U/L (ref 38–126)
Anion gap: 10 (ref 5–15)
BUN: 11 mg/dL (ref 6–20)
CO2: 24 mmol/L (ref 22–32)
Calcium: 9.4 mg/dL (ref 8.9–10.3)
Chloride: 109 mmol/L (ref 101–111)
Creatinine, Ser: 1.12 mg/dL (ref 0.61–1.24)
GFR calc Af Amer: >60 mL/min (ref >60)
GFR calc non Af Amer: >60 mL/min (ref >60)
Glucose, Bld: 124 mg/dL — ABNORMAL HIGH (ref 65–99)
Potassium: 3.6 mmol/L (ref 3.5–5.1)
Sodium: 143 mmol/L (ref 135–145)
Total Bilirubin: 0.7 mg/dL (ref 0.3–1.2)
Total Protein: 7.6 g/dL (ref 6.5–8.1)

## 2015-09-26 LAB — ETHANOL: Alcohol, Ethyl (B): 209 mg/dL — ABNORMAL HIGH (ref <5)

## 2015-09-26 MED ORDER — SODIUM CHLORIDE 0.9 % IV BOLUS (SEPSIS)
1000.0000 mL | Freq: Once | INTRAVENOUS | Status: AC
  Administered 2015-09-26: 1000 mL via INTRAVENOUS
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
## 2015-09-26 NOTE — ED Notes (Signed)
Nurse drawing labs. 
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
## 2015-09-27 LAB — RAPID URINE DRUG SCREEN, HOSP PERFORMED
Amphetamines: NOT DETECTED
Barbiturates: NOT DETECTED
Benzodiazepines: NOT DETECTED
Cocaine: NOT DETECTED
Opiates: NOT DETECTED
Tetrahydrocannabinol: NOT DETECTED

## 2015-09-27 NOTE — Discharge Instructions (Signed)
Please follow with your primary care doctor in the next 2 days for a check-up. They must obtain records for further management.   Do not hesitate to return to the Emergency Department for any new, worsening or concerning symptoms.     Alcohol Intoxication Alcohol intoxication occurs when the amount of alcohol that a person has consumed impairs his or her ability to mentally and physically function. Alcohol directly impairs the normal chemical activity of the brain. Drinking large amounts of alcohol can lead to changes in mental function and behavior, and it can cause many physical effects that can be harmful.  Alcohol intoxication can range in severity from mild to very severe. Various factors can affect the level of intoxication that occurs, such as the person's age, gender, weight, frequency of alcohol consumption, and the presence of other medical conditions (such as diabetes, seizures, or heart conditions). Dangerous levels of alcohol intoxication may occur when people drink large amounts of alcohol in a short period (binge drinking). Alcohol can also be especially dangerous when combined with certain prescription medicines or "recreational" drugs. SIGNS AND SYMPTOMS Some common signs and symptoms of mild alcohol intoxication include:  Loss of coordination.  Changes in mood and behavior.  Impaired judgment.  Slurred speech. As alcohol intoxication progresses to more severe levels, other signs and symptoms will appear. These may include:  Vomiting.  Confusion and impaired memory.  Slowed breathing.  Seizures.  Loss of consciousness. DIAGNOSIS  Your health care provider will take a medical history and perform a physical exam. You will be asked about the amount and type of alcohol you have consumed. Blood tests will be done to measure the concentration of alcohol in your blood. In many places, your blood alcohol level must be lower than 80 mg/dL (4.09%0.08%) to legally drive. However, many  dangerous effects of alcohol can occur at much lower levels.  TREATMENT  People with alcohol intoxication often do not require treatment. Most of the effects of alcohol intoxication are temporary, and they go away as the alcohol naturally leaves the body. Your health care provider will monitor your condition until you are stable enough to go home. Fluids are sometimes given through an IV access tube to help prevent dehydration.  HOME CARE INSTRUCTIONS  Do not drive after drinking alcohol.  Stay hydrated. Drink enough water and fluids to keep your urine clear or pale yellow. Avoid caffeine.   Only take over-the-counter or prescription medicines as directed by your health care provider.  SEEK MEDICAL CARE IF:   You have persistent vomiting.   You do not feel better after a few days.  You have frequent alcohol intoxication. Your health care provider can help determine if you should see a substance use treatment counselor. SEEK IMMEDIATE MEDICAL CARE IF:   You become shaky or tremble when you try to stop drinking.   You shake uncontrollably (seizure).   You throw up (vomit) blood. This may be bright red or may look like black coffee grounds.   You have blood in your stool. This may be bright red or may appear as a black, tarry, bad smelling stool.   You become lightheaded or faint.  MAKE SURE YOU:   Understand these instructions.  Will watch your condition.  Will get help right away if you are not doing well or get worse.   This information is not intended to replace advice given to you by your health care provider. Make sure you discuss any questions you have with  your health care provider.   Document Released: 08/25/2005 Document Revised: 07/18/2013 Document Reviewed: 04/20/2013 Elsevier Interactive Patient Education [employer]

## 2016-09-08 ENCOUNTER — Ambulatory Visit (INDEPENDENT_AMBULATORY_CARE_PROVIDER_SITE_OTHER): Payer: BLUE CROSS/BLUE SHIELD | Admitting: Podiatry

## 2016-09-08 ENCOUNTER — Encounter: Payer: Self-pay | Admitting: Podiatry

## 2016-09-08 VITALS — BP 128/68 | HR 82 | Ht 72.0 in | Wt 212.0 lb

## 2016-09-08 DIAGNOSIS — M21969 Unspecified acquired deformity of unspecified lower leg: Secondary | ICD-10-CM

## 2016-09-08 DIAGNOSIS — B353 Tinea pedis: Secondary | ICD-10-CM | POA: Diagnosis not present

## 2016-09-08 DIAGNOSIS — M79673 Pain in unspecified foot: Secondary | ICD-10-CM

## 2016-09-08 DIAGNOSIS — B351 Tinea unguium: Secondary | ICD-10-CM | POA: Diagnosis not present

## 2016-09-08 DIAGNOSIS — M216X9 Other acquired deformities of unspecified foot: Secondary | ICD-10-CM

## 2016-09-08 DIAGNOSIS — M722 Plantar fascial fibromatosis: Secondary | ICD-10-CM

## 2016-09-08 DIAGNOSIS — M79606 Pain in leg, unspecified: Secondary | ICD-10-CM

## 2016-09-08 MED ORDER — TERBINAFINE HCL 250 MG PO TABS: 250.0000 mg | ORAL_TABLET | Freq: Every day | ORAL | 2 refills | Status: DC

## 2016-09-08 NOTE — Progress Notes (Signed)
SUBJECTIVE: 35 y.o. year old male presents complaining of tightness and lumpy feeling in arch area L>R x 3 weeks [facility]  #2. Also pain along the first MPJ plantar medial aspect on both feet x 1 months.  #3. Problem with peeling skin on both feet. #4. Toe nails are too thick and discolored. Wish to have healthier nail.  On feet not much but does a lot of running, 2-3 miles daily.  Patient is preparing for [place] in March 2018.   REVIEW OF SYSTEMS: Pertinent items noted in HPI and remainder of comprehensive ROS otherwise negative.  OBJECTIVE: DERMATOLOGIC EXAMINATION: Dry scaly and peeling skin plantar surface on both feet forefoot and rearfoot.  Thick dystrophic and discolored nails x 10.  VASCULAR EXAMINATION OF LOWER LIMBS: All pedal pulses are palpable with normal pulsation.  Capillary Filling times within 3 seconds in all digits.  No edema or erythema noted.  Temperature gradient from tibial crest to dorsum of foot is within normal bilateral.  NEUROLOGIC EXAMINATION OF THE LOWER LIMBS: All epicritic and tactile sensations grossly intact.   MUSCULOSKELETAL EXAMINATION: Positive for tight Achilles tendon bilateral. Positive for elevated and unstable first ray bilateral. Depressed arch height and pronating STJ with weight bearing bilateral.   ASSESSMENT: 1. Ankle equinus bilateral. 2. Unstable and elevated first ray bilateral. 3. STJ hyperpronation with flattening instep with weight bearing. 4. Tinea pedis planta bilateral. 5. Onychomycosis x 10. 6. Plantar fasciitis bilateral.  PLAN: Reviewed clinical findings and available treatment options; stretch exercise, custom orthotics, topical and oral treatment of fungal infection. Reviewed and demonstrated Achilles tendon stretch exercise. Patient is to scrub both feet with Salsun blue after each shower. Rx. Lamisil e-scribed as per request. Will do blood work after 4 weeks. Return for custom orthotics  and x-ray examination.

## 2016-09-08 NOTE — Patient Instructions (Signed)
Seen for pain in both arches and at the great toe. Also have problem with dry peeling skin on both feet, fungal nails and hypertrophic nails. Noted of tight Achilles tendon bilateral, weak first metatarsal bone, Tinea pedis, fungal nails with deformity. Reviewed daily stretch exercise, benefit of custom orthotics, daily scrub with Salsun blue shampoo, and Lamisil antifungal treatment. Take medication as prescribed. Return for custom orthotics.

## 2016-09-14 ENCOUNTER — Ambulatory Visit: Payer: BLUE CROSS/BLUE SHIELD | Admitting: Podiatry

## 2016-09-16 ENCOUNTER — Encounter: Payer: Self-pay | Admitting: Podiatry

## 2016-09-16 ENCOUNTER — Ambulatory Visit (INDEPENDENT_AMBULATORY_CARE_PROVIDER_SITE_OTHER): Payer: BLUE CROSS/BLUE SHIELD | Admitting: Podiatry

## 2016-09-16 DIAGNOSIS — M21969 Unspecified acquired deformity of unspecified lower leg: Secondary | ICD-10-CM

## 2016-09-16 DIAGNOSIS — M216X9 Other acquired deformities of unspecified foot: Secondary | ICD-10-CM | POA: Diagnosis not present

## 2016-09-16 DIAGNOSIS — M722 Plantar fascial fibromatosis: Secondary | ICD-10-CM

## 2016-09-16 NOTE — Patient Instructions (Signed)
Both feet casted for orthotics. Will contact when orthotics are ready.  

## 2016-09-16 NOTE — Progress Notes (Signed)
SUBJECTIVE: 35 y.o. year old male presents requesting custom orthotics made.  Last visit he had problem with tightness and lumpy feeling in arch area L>R x 3 weeks [facility]  #2. Pain along the first MPJ plantar medial aspect on both feet x 1 months.  #3. Dry peeling skin on both feet. #4. Toe nails are too thick and discolored. Wish to have healthier nail.  On feet not much but does a lot of running, 2-3 miles daily.  Patient is preparing for [place] in March 2018.   OBJECTIVE: DERMATOLOGIC EXAMINATION: Dry scaly and peeling skin plantar surface on both feet forefoot and rearfoot.  Thick dystrophic and discolored nails x 10.  VASCULAR EXAMINATION OF LOWER LIMBS: No abnormal findings.   NEUROLOGIC EXAMINATION OF THE LOWER LIMBS: All epicritic and tactile sensations grossly intact.   MUSCULOSKELETAL EXAMINATION: Positive for tight Achilles tendon bilateral. Positive for elevated and unstable first ray bilateral. Depressed arch height and pronating STJ with weight bearing bilateral. :   Radiographic examination reveal: Mild metatarsus adductus bilateral, medial shifting of Talar head.  Lateral: Mild anterior advancement of CYMA line left, severe elevation of the first metatarsal on both feet. No acute changes seen. Normal articular structures.  ASSESSMENT: 1. Ankle equinus bilateral. 2. Unstable and elevated first ray bilateral. 3. STJ hyperpronation with flattening instep with weight bearing. 4. Tinea pedis planta bilateral. 5. Onychomycosis x 10. 6. Plantar fasciitis bilateral.  PLAN: On last visit we reviewed clinical findings and available treatment options; stretch exercise, custom orthotics, topical and oral treatment of fungal infection. Reviewed and demonstrated Achilles tendon stretch exercise. Patient is to scrub both feet with Salsun blue after each shower. Rx. Lamisil e-scribed as per request. Will do blood work after 4 weeks. Both  feet casted for orthotics.

## 2016-10-13 ENCOUNTER — Ambulatory Visit: Payer: BLUE CROSS/BLUE SHIELD | Admitting: Podiatry

## 2016-12-10 ENCOUNTER — Other Ambulatory Visit: Payer: Self-pay | Admitting: Podiatry

## 2018-03-23 ENCOUNTER — Ambulatory Visit: Payer: Self-pay | Admitting: Registered"

## 2018-04-03 ENCOUNTER — Ambulatory Visit: Payer: Self-pay | Admitting: Skilled Nursing Facility1

## 2018-09-14 DIAGNOSIS — L649 Androgenic alopecia, unspecified: Secondary | ICD-10-CM | POA: Insufficient documentation

## 2019-02-20 ENCOUNTER — Other Ambulatory Visit: Payer: Self-pay

## 2019-02-20 ENCOUNTER — Ambulatory Visit: Payer: BLUE CROSS/BLUE SHIELD | Admitting: Internal Medicine

## 2019-02-20 ENCOUNTER — Encounter: Payer: Self-pay | Admitting: Internal Medicine

## 2019-02-20 VITALS — BP 116/78 | HR 68 | Temp 97.8°F | Ht 72.0 in | Wt 233.6 lb

## 2019-02-20 DIAGNOSIS — E559 Vitamin D deficiency, unspecified: Secondary | ICD-10-CM

## 2019-02-20 DIAGNOSIS — R7309 Other abnormal glucose: Secondary | ICD-10-CM | POA: Diagnosis not present

## 2019-02-20 DIAGNOSIS — J4520 Mild intermittent asthma, uncomplicated: Secondary | ICD-10-CM

## 2019-02-20 DIAGNOSIS — J301 Allergic rhinitis due to pollen: Secondary | ICD-10-CM | POA: Diagnosis not present

## 2019-02-20 MED ORDER — LEVOCETIRIZINE DIHYDROCHLORIDE 5 MG PO TABS: 5.0000 mg | ORAL_TABLET | Freq: Every evening | ORAL | 1 refills | Status: DC

## 2019-02-21 LAB — LIPID PANEL
Cholesterol: 178 (ref 0–200)
HDL: 61 (ref 35–70)
LDL Cholesterol: 89
LDl/HDL Ratio: 2.9
Triglycerides: 180 — AB (ref 40–160)

## 2019-02-21 LAB — VITAMIN D 25 HYDROXY (VIT D DEFICIENCY, FRACTURES): Vit D, 25-Hydroxy: 27

## 2019-02-21 LAB — CBC AND DIFFERENTIAL
HCT: 49 (ref 41–53)
Hemoglobin: 15.4 (ref 13.5–17.5)
Neutrophils Absolute: 2856
Platelets: 372 (ref 150–399)
WBC: 5.9

## 2019-02-21 LAB — BASIC METABOLIC PANEL
BUN: 13 (ref 4–21)
Creatinine: 1.1 (ref 0.6–1.3)
Glucose: 89
Potassium: 4 (ref 3.4–5.3)
Sodium: 140 (ref 137–147)

## 2019-02-21 LAB — HEPATIC FUNCTION PANEL
ALT: 35 (ref 10–40)
AST: 31 (ref 14–40)
Alkaline Phosphatase: 61 (ref 25–125)
Bilirubin, Total: 0.4

## 2019-02-21 LAB — HEMOGLOBIN A1C: Hemoglobin A1C: 6

## 2019-02-22 ENCOUNTER — Encounter: Payer: Self-pay | Admitting: Internal Medicine

## 2019-02-24 ENCOUNTER — Ambulatory Visit (HOSPITAL_COMMUNITY)
Admission: EM | Admit: 2019-02-24 | Discharge: 2019-02-24 | Disposition: A | Discharge status: Home / Self Care | Payer: BLUE CROSS/BLUE SHIELD | Attending: Family Medicine | Admitting: Family Medicine

## 2019-02-24 ENCOUNTER — Encounter (HOSPITAL_COMMUNITY): Payer: Self-pay

## 2019-02-24 ENCOUNTER — Other Ambulatory Visit: Payer: Self-pay

## 2019-02-24 DIAGNOSIS — M109 Gout, unspecified: Secondary | ICD-10-CM

## 2019-02-24 MED ORDER — INDOMETHACIN 50 MG PO CAPS: 50.0000 mg | ORAL_CAPSULE | Freq: Three times a day (TID) | ORAL | 0 refills | Status: AC

## 2019-02-24 NOTE — Discharge Instructions (Signed)
Prescribed indomethacin.  Take as directed and to completion.   Rest, ice, and elevate Follow up with PCP as needed Return or go to the ED if you have any new or worsening symptoms fever, chills, nausea, vomiting, worsening symptoms despite treatment, worsening redness, swelling, etc..Marland Kitchen

## 2019-02-24 NOTE — ED Triage Notes (Signed)
Patient presents to Urgent Care with complaints of left foot pain up by his big toe since last night. Patient states he does not think he has done something to injure it, was sitting on the couch last night when the pain began. Pt took advil for pain with minimal relief.

## 2019-02-24 NOTE — ED Notes (Signed)
Patient verbalizes understanding of discharge instructions. Opportunity for questioning and answers were provided. Patient discharged from [facility] by RN.  

## 2019-02-24 NOTE — ED Provider Notes (Signed)
[facility]   [id number] 02/24/19 Arrival Time: 1152  CC: Left great toe pain  SUBJECTIVE: History from: patient. Brady Wells is a 38 y.o. male complains of left great toe pain that began 1 day ago.  Denies a precipitating event or specific injury.  Localizes the pain to the left great toe.  Describes the pain as constant and throbbing in character.  Has tried OTC medications with minimal relief.  Worse with walking.  Denies similar symptoms in the past.  Denies fever, chills, ecchymosis, weakness, numbness and tingling.      ROS: As per HPI.  Past Medical History:  Diagnosis Date  . Asthma   . Depression    Past Surgical History:  Procedure Laterality Date  . APPENDECTOMY  1999   Allergies  Allergen Reactions  . Bee Venom Swelling   No current facility-administered medications on file prior to encounter.    Current Outpatient Medications on File Prior to Encounter  Medication Sig Dispense Refill  . ADVAIR DISKUS 100-50 MCG/DOSE AEPB Inhale 1 puff into the lungs 2 (two) times daily.  5  . albuterol (PROVENTIL HFA;VENTOLIN HFA) 108 (90 BASE) MCG/ACT inhaler Inhale 2 puffs into the lungs every 6 (six) hours as needed for wheezing.    Marland Kitchen EPIPEN 2-PAK 0.3 MG/0.3ML SOAJ injection Inject 0.3 mLs into the muscle as needed.  3  . levocetirizine (XYZAL) 5 MG tablet Take 1 tablet (5 mg total) by mouth every evening. 90 tablet 1  . Multiple Vitamin (MULTIVITAMIN WITH MINERALS) TABS Take 1 tablet by mouth daily.    . TRANSDERM-SCOP, 1.5 MG, 1 MG/3DAYS Place 1 patch onto the skin every 3 (three) days. At least 4 hrs before needed effect.  0   Social History   Socioeconomic History  . Marital status: Single    Spouse name: Not on file  . Number of children: Not on file  . Years of education: Not on file  . Highest education level: Not on file  Occupational History  . Not on file  Social Needs  . Financial resource strain: Not on file  . Food insecurity:    Worry: Not  on file    Inability: Not on file  . Transportation needs:    Medical: Not on file    Non-medical: Not on file  Tobacco Use  . Smoking status: Never Smoker  . Smokeless tobacco: Never Used  Substance and Sexual Activity  . Alcohol use: Yes    Comment: socially  . Drug use: Not on file  . Sexual activity: Not on file  Lifestyle  . Physical activity:    Days per week: Not on file    Minutes per session: Not on file  . Stress: Not on file  Relationships  . Social connections:    Talks on phone: Not on file    Gets together: Not on file    Attends religious service: Not on file    Active member of club or organization: Not on file    Attends meetings of clubs or organizations: Not on file    Relationship status: Not on file  . Intimate partner violence:    Fear of current or ex partner: Not on file    Emotionally abused: Not on file    Physically abused: Not on file    Forced sexual activity: Not on file  Other Topics Concern  . Not on file  Social History Narrative  . Not on file   History reviewed.  No pertinent family history.  OBJECTIVE:  Vitals:   02/24/19 1236 02/24/19 1238  BP:  130/90  Pulse:  70  Resp:  19  Temp:  98 F (36.7 C)  TempSrc:  Oral  SpO2:  100%  Weight: 228 lb (103.4 kg)   Height: 6' (1.829 m)     General appearance: Alert; in no acute distress.  Head: NCAT Lungs: normal respiratory effort CV: Posterior tibialis and dorsalis pedis pulse 2+ and intact Musculoskeletal: Left foot Inspection: Mild swelling and erythema Palpation: TTP over left first MTP joint and first phalange ROM: LROM about the first toe Strength: 5/5 dorsiflexion, 5/5 plantar flexion Skin: warm and dry Neurologic: Ambulates with minimal difficulty; Sensation intact about the lower extremities Psychological: alert and cooperative; normal mood and affect  ASSESSMENT & PLAN:  1. 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 indomethacin (INDOCIN) 50 MG capsule    Sig: Take 1 capsule (50 mg total) by mouth 3 (three) times daily with meals for 7 days.    Dispense:  21 capsule    Refill:  0    Order Specific Question:   Supervising Provider    Answer:   Eustace Moore [[id number]]   Prescribed indomethacin.  Take as directed and to completion.   Rest, ice, and elevate Follow up with PCP as needed Return or go to the ED if you have any new or worsening symptoms fever, chills, nausea, vomiting, worsening symptoms despite treatment, worsening redness, swelling, etc...   Reviewed expectations re: course of current medical issues. Questions answered. Outlined signs and symptoms indicating need for more acute intervention. Patient verbalized understanding. After Visit Summary given.    Rennis Harding, PA-C 02/24/19 1306

## 2019-03-25 ENCOUNTER — Encounter: Payer: Self-pay | Admitting: Internal Medicine

## 2019-03-25 NOTE — Progress Notes (Signed)
Subjective:     Patient ID: [name] , male    DOB: August 14, 1981 , 38 y.o.   MRN: [id number]   Chief Complaint  Patient presents with  . referral for cardio pulmonary function test    HPI  He is here today for a referral.  He has decided to enlist in the [employer]. He has been advised that he needs pulmonary evaluation due to his asthma diagnosis. He has not had any recent flares. He reports his symptoms are usually worse in the Spring, when pollen levels are high. He has been taking his allergy meds. He denies any prior intubations.  He is using his albuterol MDI less than once a week. He denies having any nocturnal symptoms.     Past Medical History:  Diagnosis Date  . Asthma   . Depression      No family history on file.   Current Outpatient Medications:  Marland Kitchen.  Multiple Vitamin (MULTIVITAMIN WITH MINERALS) TABS, Take 1 tablet by mouth daily., Disp: , Rfl:  .  ADVAIR DISKUS 100-50 MCG/DOSE AEPB, Inhale 1 puff into the lungs 2 (two) times daily., Disp: , Rfl: 5 .  albuterol (PROVENTIL HFA;VENTOLIN HFA) 108 (90 BASE) MCG/ACT inhaler, Inhale 2 puffs into the lungs every 6 (six) hours as needed for wheezing., Disp: , Rfl:  .  EPIPEN 2-PAK 0.3 MG/0.3ML SOAJ injection, Inject 0.3 mLs into the muscle as needed., Disp: , Rfl: 3 .  levocetirizine (XYZAL) 5 MG tablet, Take 1 tablet (5 mg total) by mouth every evening., Disp: 90 tablet, Rfl: 1 .  TRANSDERM-SCOP, 1.5 MG, 1 MG/3DAYS, Place 1 patch onto the skin every 3 (three) days. At least 4 hrs before needed effect., Disp: , Rfl: 0   Allergies  Allergen Reactions  . Bee Venom Swelling     Review of Systems  Constitutional: Negative.   Respiratory: Negative.   Cardiovascular: Negative.   Gastrointestinal: Negative.   Neurological: Negative.   Psychiatric/Behavioral: Negative.      Today's Vitals   02/20/19 1545  BP: 116/78  Pulse: 68  Temp: 97.8 F (36.6 C)  TempSrc: Oral  Weight: 233 lb 9.6 oz (106 kg)  Height: 6'  (1.829 m)   Body mass index is 31.68 kg/m.   Objective:  Physical Exam Vitals signs and nursing note reviewed.  Constitutional:      Appearance: Normal appearance.  Cardiovascular:     Rate and Rhythm: Normal rate and regular rhythm.     Heart sounds: Normal heart sounds.  Pulmonary:     Effort: Pulmonary effort is normal.     Breath sounds: Normal breath sounds.  Skin:    General: Skin is warm.  Neurological:     General: No focal deficit present.     Mental Status: He is alert.  Psychiatric:        Mood and Affect: Mood normal.         Assessment And Plan:     1. Mild intermittent asthma without complication  Chronic, yet stable. He will continue with current meds. I will refer him to Pulmonary as requested. Importance of medication compliance was discussed with the patient.  - Ambulatory referral to Pulmonology  2. Seasonal allergic rhinitis due to pollen  Chronic. He will continue with current meds. I will add montelukast if needed.   3. Other abnormal glucose  HIS A1C HAS BEEN ELEVATED IN THE PAST. I WILL CHECK AN A1C, BMET TODAY AT HIS NEXT VISIT. HE IS ENCOURAGED  TO AVOID SUGARY BEVERAGES AND PROCESSED FOODS INCLUDNG BREADS, RICE AND PASTA.   4. Vitamin D deficiency disease  He is encouraged to take vit d3 5000 units daily. He is advised to purchase this OTC and in capsule form.   Gwynneth Aliment, MD    THE PATIENT IS ENCOURAGED TO PRACTICE SOCIAL DISTANCING DUE TO THE COVID-19 PANDEMIC.

## 2019-03-26 ENCOUNTER — Telehealth: Payer: Self-pay

## 2019-03-26 ENCOUNTER — Other Ambulatory Visit: Payer: Self-pay

## 2019-03-26 MED ORDER — VITAMIN D3 1.25 MG (50000 UT) PO CAPS: ORAL_CAPSULE | ORAL | 0 refills | Status: DC

## 2019-03-26 NOTE — Telephone Encounter (Signed)
-----   Message from Dorothyann Peng, MD sent at 03/25/2019  1:48 PM EDT ----- Back to him - vit d level is low. Pls send rx vit d - take twice weekly on Tues/Fridays -#24/zero refills. Must take 5000 units once daily OTC once he runs out of rx

## 2019-03-26 NOTE — Telephone Encounter (Signed)
PT NOTIFIED liver, kidneys ok. Chol is okay - triglycerides slightly elevated, likely because he was not fasting. He is prediabetic, with an a1c 6.0. pls increase exercise. Avoid desserts, sugary beverages

## 2019-03-26 NOTE — Telephone Encounter (Signed)
Pt.notified

## 2019-03-27 ENCOUNTER — Institutional Professional Consult (permissible substitution): Payer: Self-pay | Admitting: Internal Medicine

## 2019-04-04 ENCOUNTER — Other Ambulatory Visit: Payer: Self-pay

## 2019-04-04 ENCOUNTER — Ambulatory Visit: Payer: BLUE CROSS/BLUE SHIELD | Admitting: Internal Medicine

## 2019-04-04 ENCOUNTER — Encounter: Payer: Self-pay | Admitting: Internal Medicine

## 2019-04-04 VITALS — BP 90/60 | HR 74 | Ht 72.0 in | Wt 236.0 lb

## 2019-04-04 DIAGNOSIS — J301 Allergic rhinitis due to pollen: Secondary | ICD-10-CM

## 2019-04-04 DIAGNOSIS — J4520 Mild intermittent asthma, uncomplicated: Secondary | ICD-10-CM | POA: Diagnosis not present

## 2019-04-04 NOTE — Progress Notes (Signed)
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   Dyspnea:  8 min or less per mile in all conditions  Cough: no Sleep: no resp c/os SABA use: none  No obvious day to day or daytime variability or assoc excess/ purulent sputum or mucus plugs or hemoptysis or cp or chest tightness, subjective wheeze or overt sinus or hb symptoms.   Sleeping flat without nocturnal  or early am exacerbation  of respiratory  c/o's or need for noct saba. Also denies any obvious fluctuation of symptoms with weather or environmental changes or other aggravating or alleviating factors except as outlined above   No unusual exposure hx or h/o childhood pna/ asthma or knowledge of premature birth.  Current Allergies, Complete Past Medical History, Past Surgical History, Family History, and Social History were reviewed in [employer] record.  ROS  The following are not active complaints unless bolded Hoarseness, sore throat, dysphagia, dental problems, itching, sneezing,  nasal congestion or discharge of excess mucus or purulent secretions, ear ache,   fever, chills, sweats, unintended wt loss or wt gain, classically pleuritic or exertional cp,  orthopnea pnd or arm/hand swelling  or leg swelling,  presyncope, palpitations, abdominal pain, anorexia, nausea, vomiting, diarrhea  or change in bowel habits or change in bladder habits, change in stools or change in urine, dysuria, hematuria,  rash, arthralgias, visual complaints, headache, numbness, weakness or ataxia or problems with walking or coordination,  change in mood or  memory.              Past Medical History:  Diagnosis Date   Asthma    Depression     Outpatient Medications Prior to Visit  Medication Sig Dispense Refill                 Cholecalciferol (VITAMIN D3) 1.25 MG (50000 UT) CAPS Take 1 capsule by mouth on Tuesday and Friday 24 capsule 0   EPIPEN 2-PAK 0.3 MG/0.3ML SOAJ injection Inject 0.3 mLs into the muscle as needed.  3   levocetirizine (XYZAL) 5 MG tablet Take 1 tablet (5 mg total) by mouth every evening. 90 tablet 1   Multiple Vitamin (MULTIVITAMIN WITH MINERALS) TABS Take 1 tablet by mouth daily.     TRANSDERM-SCOP, 1.5 MG, 1 MG/3DAYS Place 1 patch onto the skin every 3 (three) days. At least 4 hrs before needed effect.  0      Objective:     BP 90/60 (BP Location: Left Arm, Cuff Size: Normal)    Pulse 74    Ht 6' (1.829 m)    Wt 236 lb (107 kg)    SpO2 98%    BMI 32.01 kg/m   SpO2: 98 %  RA   amb bm nad  HEENT: nl dentition, turbinates bilaterally, and oropharynx. Nl external ear canals without cough reflex   NECK :  without JVD/Nodes/TM/ nl carotid upstrokes bilaterally   LUNGS: no acc muscle use,  Nl contour chest which is clear to A and P bilaterally without cough on insp or exp maneuvers   CV:  RRR  no s3 or murmur or increase in P2, and no edema   ABD:  soft and nontender with nl inspiratory excursion in the supine position. No bruits or organomegaly appreciated, bowel sounds nl  MS:  Nl gait/ ext warm without deformities, calf tenderness, cyanosis or clubbing No obvious joint restrictions   SKIN: warm and dry without lesions    NEURO:  alert, approp, nl sensorium with   no motor or cerebellar deficits apparent.          Assessment   Mild intermittent asthma without complication/ hx remotely suggestive of but clinically resolved -Spirometry   05/06/09   FEV1 4.47 (109%)  Ratio 0.90 off all rx with no curvature in effort indep portion of f/v loop    In remission for a decade s flare    Seasonal rhinitis  - eval 2020 [facility]   Controlled with otc's/ no pulmonary or allergy f/u indicated    Although I think it is possible that it one  time in his life (remotely)  he had enough rhinitis to cause secondary asthma, I see no evidence at all for active disease presently noting that he never used inhalers and yet can exercise in all conditions with no evidence of any asthma flares with extreme cold extreme heat or seasonal change or URIs.Marland Kitchen.  He is therefore cleared for [employer]  Training without restrictions and follow-up here is as needed.      Total time devoted to counseling  > 50 % of initial 45 min office visit:  review case with pt/ discussion of options/alternatives/ personally creating written customized instructions  in presence of pt  then going over those specific  Instructions directly with the pt including how to use all of the meds but in particular covering each new medication in detail and the difference between the maintenance= "automatic" meds and the prns using an action plan format for the latter (If this problem/symptom => do that organization reading Left to right).  Please see AVS from this visit for a full list of these instructions which I personally wrote for this pt and  are unique to this visit.      Sandrea HughsMichael Taiyana Kissler, MD 04/04/2019

## 2019-04-04 NOTE — Patient Instructions (Signed)
Clinically you do not appear to have any asthma at all and your allergic rhinitis is well controlled and should not restrict in any way from serving in the [employer].   If they require it to prove the above, you will need an asthma challenge test scheduled thru this office to be done at [facility] - call if needed

## 2019-04-10 ENCOUNTER — Telehealth: Payer: Self-pay | Admitting: Internal Medicine

## 2019-04-10 NOTE — Telephone Encounter (Signed)
Attempted to call Victorino Dike with [facility] but the line rang 4 times and then a recording came on stating that they were busy helping other patients and to call back at a later time. Will try to call back later.

## 2019-04-11 ENCOUNTER — Encounter: Payer: Self-pay | Admitting: Internal Medicine

## 2019-04-11 NOTE — Assessment & Plan Note (Signed)
Controlled with otc's/ no pulmonary or allergy f/u indicated

## 2019-04-11 NOTE — Assessment & Plan Note (Signed)
Spirometry   05/06/09   FEV1 4.47 (109%)  Ratio 0.90 off all rx with no curvature in effort indep portion of f/v loop    Although I think it is possible that it one  time in his life (remotely)  he had enough rhinitis to cause secondary asthma, I see no evidence at all for active disease presently noting that he never used inhalers and yet can exercise in all conditions with no evidence of any asthma flares with extreme cold extreme heat or seasonal change or URIs.Marland Kitchen  He is therefore cleared for [employer] without restrictions and follow-up here is as needed.   Total time devoted to counseling  > 50 % of initial 45 min office visit:  review case with pt/ discussion of options/alternatives/ personally creating written customized instructions  in presence of pt  then going over those specific  Instructions directly with the pt including how to use all of the meds but in particular covering each new medication in detail and the difference between the maintenance= "automatic" meds and the prns using an action plan format for the latter (If this problem/symptom => do that organization reading Left to right).  Please see AVS from this visit for a full list of these instructions which I personally wrote for this pt and  are unique to this visit.

## 2019-04-11 NOTE — Telephone Encounter (Signed)
Spoke with Brady Wells at [facility] and she stated the last spirometry that was done was in 2010 and states she would send it to [place] but doesn't know if you are looking for something recent. FYI MW

## 2019-04-16 NOTE — Telephone Encounter (Signed)
Called [facility] spoke with Victorino Dike., Advised her of Dr. Thurston Hole response She stated that was all the information they had and forwarded it to our office. Nothing further needed at this time.

## 2019-04-16 NOTE — Telephone Encounter (Signed)
MW please advise if anything further is needed regarding updated spirometry on this patient. Thank you  Spoke with Victorino Dike at [facility] and she stated the last spirometry that was done was in 2010 and states she would send it to [place] but doesn't know if you are looking for something recent.

## 2019-04-16 NOTE — Telephone Encounter (Signed)
If that's all they have that's fine - I've reviewed it and it does not support dx of asthma

## 2019-04-17 ENCOUNTER — Telehealth: Payer: Self-pay | Admitting: Internal Medicine

## 2019-04-17 NOTE — Telephone Encounter (Signed)
FYI Pt states MW left him a message re: prescriber of Advair 100/50 Pt states it was Dr. Victorino December [facility] who prescribed Advair for him.

## 2019-04-17 NOTE — Telephone Encounter (Signed)
[place]

## 2019-06-07 ENCOUNTER — Other Ambulatory Visit: Payer: Self-pay | Admitting: Internal Medicine

## 2019-06-15 ENCOUNTER — Other Ambulatory Visit: Payer: Self-pay | Admitting: Internal Medicine

## 2019-07-31 ENCOUNTER — Other Ambulatory Visit: Payer: Self-pay | Admitting: Internal Medicine

## 2019-08-16 ENCOUNTER — Other Ambulatory Visit: Payer: Self-pay

## 2019-08-16 ENCOUNTER — Encounter: Payer: Self-pay | Admitting: Internal Medicine

## 2019-08-16 ENCOUNTER — Ambulatory Visit: Payer: BC Managed Care – PPO | Admitting: Internal Medicine

## 2019-08-16 ENCOUNTER — Other Ambulatory Visit: Payer: Self-pay | Admitting: Internal Medicine

## 2019-08-16 VITALS — BP 120/68 | HR 68 | Temp 97.8°F | Ht 72.0 in | Wt 227.0 lb

## 2019-08-16 DIAGNOSIS — R7303 Prediabetes: Secondary | ICD-10-CM | POA: Diagnosis not present

## 2019-08-16 DIAGNOSIS — R140 Abdominal distension (gaseous): Secondary | ICD-10-CM

## 2019-08-16 DIAGNOSIS — R634 Abnormal weight loss: Secondary | ICD-10-CM

## 2019-08-16 DIAGNOSIS — Z1159 Encounter for screening for other viral diseases: Secondary | ICD-10-CM | POA: Diagnosis not present

## 2019-08-16 DIAGNOSIS — Z139 Encounter for screening, unspecified: Secondary | ICD-10-CM

## 2019-08-16 DIAGNOSIS — Z0000 Encounter for general adult medical examination without abnormal findings: Secondary | ICD-10-CM

## 2019-08-16 DIAGNOSIS — Z0001 Encounter for general adult medical examination with abnormal findings: Secondary | ICD-10-CM

## 2019-08-16 LAB — POCT URINALYSIS DIPSTICK
Bilirubin, UA: NEGATIVE
Blood, UA: NEGATIVE
Glucose, UA: NEGATIVE
Ketones, UA: NEGATIVE
Leukocytes, UA: NEGATIVE
Nitrite, UA: NEGATIVE
Protein, UA: NEGATIVE
Spec Grav, UA: 1.01 (ref 1.010–1.025)
Urobilinogen, UA: 0.2 E.U./dL
pH, UA: 5.5 (ref 5.0–8.0)

## 2019-08-16 NOTE — Patient Instructions (Signed)
Preventive Care 19-38 Years Old, Male Preventive care refers to lifestyle choices and visits with your health care provider that can promote health and wellness. This includes:  A yearly physical exam. This is also called an annual well check.  Regular dental and eye exams.  Immunizations.  Screening for certain conditions.  Healthy lifestyle choices, such as eating a healthy diet, getting regular exercise, not using drugs or products that contain nicotine and tobacco, and limiting alcohol use. What can I expect for my preventive care visit? Physical exam Your health care provider will check:  Height and weight. These may be used to calculate body mass index (BMI), which is a measurement that tells if you are at a healthy weight.  Heart rate and blood pressure.  Your skin for abnormal spots. Counseling Your health care provider may ask you questions about:  Alcohol, tobacco, and drug use.  Emotional well-being.  Home and relationship well-being.  Sexual activity.  Eating habits.  Work and work Statistician. What immunizations do I need?  Influenza (flu) vaccine  This is recommended every year. Tetanus, diphtheria, and pertussis (Tdap) vaccine  You may need a Td booster every 10 years. Varicella (chickenpox) vaccine  You may need this vaccine if you have not already been vaccinated. Human papillomavirus (HPV) vaccine  If recommended by your health care provider, you may need three doses over 6 months. Measles, mumps, and rubella (MMR) vaccine  You may need at least one dose of MMR. You may also need a second dose. Meningococcal conjugate (MenACWY) vaccine  One dose is recommended if you are 45-76 years old and a Market researcher living in a residence hall, or if you have one of several medical conditions. You may also need additional booster doses. Pneumococcal conjugate (PCV13) vaccine  You may need this if you have certain conditions and were not  previously vaccinated. Pneumococcal polysaccharide (PPSV23) vaccine  You may need one or two doses if you smoke cigarettes or if you have certain conditions. Hepatitis A vaccine  You may need this if you have certain conditions or if you travel or work in places where you may be exposed to hepatitis A. Hepatitis B vaccine  You may need this if you have certain conditions or if you travel or work in places where you may be exposed to hepatitis B. Haemophilus influenzae type b (Hib) vaccine  You may need this if you have certain risk factors. You may receive vaccines as individual doses or as more than one vaccine together in one shot (combination vaccines). Talk with your health care provider about the risks and benefits of combination vaccines. What tests do I need? Blood tests  Lipid and cholesterol levels. These may be checked every 5 years starting at age 17.  Hepatitis C test.  Hepatitis B test. Screening   Diabetes screening. This is done by checking your blood sugar (glucose) after you have not eaten for a while (fasting).  Sexually transmitted disease (STD) testing. Talk with your health care provider about your test results, treatment options, and if necessary, the need for more tests. Follow these instructions at home: Eating and drinking   Eat a diet that includes fresh fruits and vegetables, whole grains, lean protein, and low-fat dairy products.  Take vitamin and mineral supplements as recommended by your health care provider.  Do not drink alcohol if your health care provider tells you not to drink.  If you drink alcohol: ? Limit how much you have to 0-2  drinks a day. ? Be aware of how much alcohol is in your drink. In the [place] one drink equals one 12 oz bottle of beer (355 mL), one 5 oz glass of wine (148 mL), or one 1 oz glass of hard liquor (44 mL). Lifestyle  Take daily care of your teeth and gums.  Stay active. Exercise for at least 30 minutes on 5 or  more days each week.  Do not use any products that contain nicotine or tobacco, such as cigarettes, e-cigarettes, and chewing tobacco. If you need help quitting, ask your health care provider.  If you are sexually active, practice safe sex. Use a condom or other form of protection to prevent STIs (sexually transmitted infections). What's next?  Go to your health care provider once a year for a well check visit.  Ask your health care provider how often you should have your eyes and teeth checked.  Stay up to date on all vaccines. This information is not intended to replace advice given to you by your health care provider. Make sure you discuss any questions you have with your health care provider. Document Released: 01/11/2002 Document Revised: 11/09/2018 Document Reviewed: 11/09/2018 Elsevier Patient Education  2020 Elsevier Inc.  

## 2019-08-16 NOTE — Progress Notes (Signed)
Subjective:     Patient ID: Brady Wells , male    DOB: June 15, 1981 , 38 y.o.   MRN: [id number]   Chief Complaint  Patient presents with  . Annual Exam    HPI  Here for physical. His R ear pain x 2-3 days ago, little better today. [place]. Pt would like herpes, HIV, hep C test done besides his routine tests for physical. Has not been exposed to covid.   Past Medical History:  Diagnosis Date  . Asthma   . Depression      Family History  Problem Relation Age of Onset  . Healthy Mother   . Healthy Father      Current Outpatient Medications:  .  ADVAIR DISKUS 100-50 MCG/DOSE AEPB, Inhale 1 puff into the lungs 2 (two) times daily., Disp: , Rfl: 5 .  albuterol (VENTOLIN HFA) 108 (90 Base) MCG/ACT inhaler, INHALE 2 PUFF BY INHALATION ROUTE EVERY 4 - 6 HOURS AS NEEDED AS NEEDED, Disp: 6.7 g, Rfl: 3 .  Cholecalciferol (VITAMIN D3) 1.25 MG (50000 UT) CAPS, TAKE 1 CAPSULE BY MOUTH ON TUESDAY AND FRIDAY, Disp: 24 capsule, Rfl: 0 .  EPIPEN 2-PAK 0.3 MG/0.3ML SOAJ injection, Inject 0.3 mLs into the muscle as needed., Disp: , Rfl: 3 .  levocetirizine (XYZAL) 5 MG tablet, Take 1 tablet (5 mg total) by mouth every evening., Disp: 90 tablet, Rfl: 1 .  Multiple Vitamin (MULTIVITAMIN WITH MINERALS) TABS, Take 1 tablet by mouth daily., Disp: , Rfl:  .  scopolamine (TRANSDERM-SCOP) 1 MG/3DAYS, APPLY 1 TO HAIRLESS AREA BEHIND EAR EVERY 3 DAYS AT LEAST 4 HOURS BEFORE NEEDED EFFECT AS NEEDED, Disp: 8 patch, Rfl: 1   Allergies  Allergen Reactions  . Bee Venom Swelling     Review of Systems  All negative except R ear pain. In the past month he is having low appetite and not interested in eating much.  Denies trouble smelling. Has been having diarrhea x 2-3 days. Denies fever, chills or sweats. Has been eating his own food. Has lost 11 lbs since May. Denies polydipsia or polyuria. Admits abdominal bloating after eating anything with distention for a few weeks. No  blood in stools.  Today's Vitals   08/16/19 1435  BP: 120/68  Pulse: 68  Temp: 97.8 F (36.6 C)  TempSrc: Oral  Weight: 227 lb (103 kg)  Height: 6' (1.829 m)   Body mass index is 30.79 kg/m.   Objective:  Physical Exam   BP 120/68 (BP Location: Left Arm, Patient Position: Sitting, Cuff Size: Normal)   Pulse 68   Temp 97.8 F (36.6 C) (Oral)   Ht 6' (1.829 m)   Wt 227 lb (103 kg)   BMI 30.79 kg/m   General Appearance:    Alert, cooperative, no distress, appears stated age  Head:    Normocephalic, without obvious abnormality, atraumatic  Eyes:    PERRL, conjunctiva/corneas clear, EOM's intact, fundi    benign, both eyes       Ears:    Normal TM's and external ear canals, both ears  Nose:   Nares normal, septum midline, mucosa normal, no drainage   or sinus tenderness  Throat:   Lips, mucosa, and tongue normal; teeth and gums normal  Neck:   Supple, symmetrical, trachea midline, no adenopathy;       thyroid:  No enlargement/tenderness/nodules; no carotid   bruit  Back:     Symmetric, no curvature, ROM normal,  no CVA tenderness  Lungs:     Clear to auscultation bilaterally, respirations unlabored  Chest wall:    No tenderness or deformity  Heart:    Regular rate and rhythm, S1 and S2 normal, no murmur, rub   or gallop  Abdomen:     Soft, non-tender, bowel sounds active all four quadrants,    no masses, no organomegaly  Genitalia:    Declined since he had a normal DOT physical a few months ago.      Extremities:   Extremities normal, atraumatic, no cyanosis or edema  Pulses:   2+ and symmetric all extremities  Skin:   Skin color, texture, turgor normal, no rashes or lesions  Lymph nodes:   Cervical, supraclavicular, and axillary nodes normal  Neurologic:   CNII-XII intact. Normal strength, sensation and reflexes      Throughout. Normal Romberg, tandem gait, heel and tip toe gait.       Assessment And Plan:  1. Encounter for general adult medical examination with  abnormal findings- routine. Fu 1y - POCT Urinalysis Dipstick (81002)  2. Unintentional weight loss- 11 lbs since May, but has also make diet changes and is exercising.    3. Prediabetes- chronic.      HGBA1C ordered  4. Abdominal bloating- acute - Ambulatory referral to Gastroenterology  5. Encounter for screening- acute. Needs to stay quarantined until results come back.  - Novel Coronavirus, NAA ([facility]); Future - Novel Coronavirus, NAA ([facility]) Via [facility] we ordered CBC, CMP, serum herpes type 1&2, HIV, lipid panel, TSH, free T3&T4.   Shanequia Kendrick RODRIGUEZ-SOUTHWORTH, PA-C    THE PATIENT IS ENCOURAGED TO PRACTICE SOCIAL DISTANCING DUE TO THE COVID-19 PANDEMIC.

## 2019-08-17 LAB — NOVEL CORONAVIRUS, NAA: SARS-CoV-2, NAA: NOT DETECTED

## 2019-08-20 LAB — COMPLETE METABOLIC PANEL WITH GFR
AG Ratio: 1.3 (calc) (ref 1.0–2.5)
ALT: 30 U/L (ref 9–46)
AST: 30 U/L (ref 10–40)
Albumin: 4.4 g/dL (ref 3.6–5.1)
Alkaline phosphatase (APISO): 58 U/L (ref 36–130)
BUN: 13 mg/dL (ref 7–25)
CO2: 25 mmol/L (ref 20–32)
Calcium: 9.8 mg/dL (ref 8.6–10.3)
Chloride: 101 mmol/L (ref 98–110)
Creat: 1.06 mg/dL (ref 0.60–1.35)
GFR, Est African American: 103 mL/min/{1.73_m2} (ref >=60)
GFR, Est Non African American: 89 mL/min/{1.73_m2} (ref >=60)
Globulin: 3.3 g/dL (calc) (ref 1.9–3.7)
Glucose, Bld: 88 mg/dL (ref 65–99)
Potassium: 3.7 mmol/L (ref 3.5–5.3)
Sodium: 139 mmol/L (ref 135–146)
Total Bilirubin: 0.4 mg/dL (ref 0.2–1.2)
Total Protein: 7.7 g/dL (ref 6.1–8.1)

## 2019-08-20 LAB — EXTRA LAV TOP TUBE

## 2019-08-20 LAB — LIPID PANEL
Cholesterol: 184 mg/dL (ref <200)
HDL: 74 mg/dL (ref >=40)
LDL Cholesterol (Calc): 87 mg/dL (calc)
Non-HDL Cholesterol (Calc): 110 mg/dL (calc) (ref <130)
Total CHOL/HDL Ratio: 2.5 (calc) (ref <5.0)
Triglycerides: 134 mg/dL (ref <150)

## 2019-08-20 LAB — HSV(HERPES SIMPLEX VRS) I + II AB-IGG
HAV 1 IGG,TYPE SPECIFIC AB: <0.9 index
HSV 2 IGG,TYPE SPECIFIC AB: <0.9 index

## 2019-08-20 LAB — T4, FREE: Free T4: 1.1 ng/dL (ref 0.8–1.8)

## 2019-08-20 LAB — HEPATITIS C ANTIBODY
Hepatitis C Ab: NONREACTIVE
SIGNAL TO CUT-OFF: 0.01 (ref <1.00)

## 2019-08-20 LAB — T3, FREE: T3, Free: 3.6 pg/mL (ref 2.3–4.2)

## 2019-08-20 LAB — TSH: TSH: 4.42 mIU/L (ref 0.40–4.50)

## 2019-08-20 LAB — HIV ANTIBODY (ROUTINE TESTING W REFLEX): HIV 1&2 Ab, 4th Generation: NONREACTIVE

## 2019-08-23 ENCOUNTER — Encounter: Payer: Self-pay | Admitting: Internal Medicine

## 2019-09-08 ENCOUNTER — Other Ambulatory Visit: Payer: Self-pay | Admitting: Internal Medicine

## 2019-10-03 ENCOUNTER — Other Ambulatory Visit: Payer: Self-pay | Admitting: Internal Medicine

## 2019-10-07 ENCOUNTER — Other Ambulatory Visit: Payer: Self-pay | Admitting: Internal Medicine

## 2019-11-26 ENCOUNTER — Encounter: Payer: Self-pay | Admitting: Internal Medicine

## 2019-12-18 ENCOUNTER — Other Ambulatory Visit: Payer: Self-pay

## 2019-12-18 MED ORDER — ALBUTEROL SULFATE HFA 108 (90 BASE) MCG/ACT IN AERS: INHALATION_SPRAY | RESPIRATORY_TRACT | 3 refills | Status: DC

## 2019-12-18 MED ORDER — FLUTICASONE-SALMETEROL 100-50 MCG/DOSE IN AEPB: INHALATION_SPRAY | RESPIRATORY_TRACT | 3 refills | Status: DC

## 2019-12-18 MED ORDER — EPIPEN 2-PAK 0.3 MG/0.3ML IJ SOAJ: 0.3000 mg | INTRAMUSCULAR | 3 refills | Status: DC | PRN

## 2020-02-13 ENCOUNTER — Ambulatory Visit: Payer: BC Managed Care – PPO | Admitting: Internal Medicine

## 2020-08-25 ENCOUNTER — Encounter: Payer: BC Managed Care – PPO | Admitting: Internal Medicine

## 2020-09-09 ENCOUNTER — Encounter: Payer: BC Managed Care – PPO | Admitting: Internal Medicine

## 2021-02-15 ENCOUNTER — Encounter (HOSPITAL_BASED_OUTPATIENT_CLINIC_OR_DEPARTMENT_OTHER): Payer: Self-pay | Admitting: Emergency Medicine

## 2021-02-15 ENCOUNTER — Emergency Department (HOSPITAL_BASED_OUTPATIENT_CLINIC_OR_DEPARTMENT_OTHER)
Admission: EM | Admit: 2021-02-15 | Discharge: 2021-02-15 | Disposition: A | Discharge status: Home / Self Care | Payer: BC Managed Care – PPO | Attending: Emergency Medicine | Admitting: Emergency Medicine

## 2021-02-15 ENCOUNTER — Emergency Department (HOSPITAL_BASED_OUTPATIENT_CLINIC_OR_DEPARTMENT_OTHER): Payer: BC Managed Care – PPO

## 2021-02-15 ENCOUNTER — Other Ambulatory Visit: Payer: Self-pay

## 2021-02-15 DIAGNOSIS — J45909 Unspecified asthma, uncomplicated: Secondary | ICD-10-CM | POA: Diagnosis not present

## 2021-02-15 DIAGNOSIS — R059 Cough, unspecified: Secondary | ICD-10-CM | POA: Diagnosis present

## 2021-02-15 DIAGNOSIS — Z7951 Long term (current) use of inhaled steroids: Secondary | ICD-10-CM | POA: Insufficient documentation

## 2021-02-15 DIAGNOSIS — J40 Bronchitis, not specified as acute or chronic: Secondary | ICD-10-CM | POA: Diagnosis not present

## 2021-02-15 MED ORDER — DOXYCYCLINE HYCLATE 100 MG PO CAPS: 100.0000 mg | ORAL_CAPSULE | Freq: Two times a day (BID) | ORAL | 0 refills | Status: DC

## 2021-02-15 MED ORDER — DOXYCYCLINE HYCLATE 100 MG PO TABS
100.0000 mg | ORAL_TABLET | Freq: Once | ORAL | Status: AC
  Administered 2021-02-15: 100 mg via ORAL
  Filled 2021-02-15: qty 1

## 2021-02-15 MED ORDER — BENZONATATE 100 MG PO CAPS: 100.0000 mg | ORAL_CAPSULE | Freq: Three times a day (TID) | ORAL | 0 refills | Status: DC | PRN

## 2021-02-15 NOTE — Discharge Instructions (Addendum)
Please take the antibiotic, Doxycycline, every 12 hours until gone. A side effect of this medication includes hypersensitivity to the suns rays - please take measures to protect your skin from the sun while taking this medication. You can take Tessalon every 8 hours as needed for cough. Continue using your inhalers as needed and take antihistamines.  Follow close with your primary care provider if symptoms persist. Return the emergency department for high fevers, difficulty breathing

## 2021-02-15 NOTE — ED Triage Notes (Signed)
Pt reports productive cough and congestion x 4 weeks; pt reports being seen and given Prednisone, finished yesterday; pt reports hx of allergies

## 2021-02-15 NOTE — ED Provider Notes (Signed)
[facility] EMERGENCY DEPARTMENT Provider Note   CSN: [id number] Arrival date & time: 02/15/21  1826     History Chief Complaint  Patient presents with  . Cough    Brady Wells is a 40 y.o. male with PMHx asthma, presenting to the ED with complaint of 4 weeks of cough. He states initially the symptoms felt like seasonal allergies with watery eyes, runny and congested nose, cough. He has treated with allergy medications, nyquil, his inhalers without much relief. Cough persists, productive of mostly clear sputum, sometimes yellowish. Endorses thick yellow mucus from his nose. No fevers. Sometimes feels winded. Was tested for COVID and was negative. [facility] prescribed a week of prednisone.   Daughter is also sick with similar cough, though developed new GI symptoms a few days ago and is being evaluated in the ED today as well for fussiness and diarrhea.   The history is provided by the patient.       Past Medical History:  Diagnosis Date  . Asthma   . Depression     Patient Active Problem List   Diagnosis Date Noted  . Mild intermittent asthma without complication 02/20/2019  . Seasonal allergic rhinitis due to pollen 02/20/2019  . Plantar fasciitis of left foot 09/10/2014  . Equinus deformity of foot, acquired 09/10/2014  . Metatarsal deformity 09/10/2014  . Onychomycosis 09/10/2014  . 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 APPENDECTOMY  1999       Family History  Problem Relation Age of Onset  . Healthy Mother   . Healthy Father     Social History   Tobacco Use  . Smoking status: Never Smoker  . Smokeless tobacco: Never Used  Vaping Use  . Vaping Use: Never used  Substance Use Topics  . Alcohol use: Yes    Comment: socially  . Drug use: Never    Home Medications Prior to Admission medications   Medication Sig Start Date End Date Taking? Authorizing Provider  albuterol  (VENTOLIN HFA) 108 (90 Base) MCG/ACT inhaler INHALE 2 PUFF BY INHALATION ROUTE EVERY 4 - 6 HOURS AS NEEDED AS NEEDED 12/18/19  Yes Rodriguez-Southworth, Nettie Elm, PA-C  benzonatate (TESSALON) 100 MG capsule Take 1 capsule (100 mg total) by mouth 3 (three) times daily as needed for cough. 02/15/21  Yes Robinson, [place] N, PA-C  Cholecalciferol (VITAMIN D3) 1.25 MG (50000 UT) CAPS TAKE 1 CAPSULE BY MOUTH ON TUESDAY AND FRIDAY 10/08/19  Yes Dorothyann Peng, MD  doxycycline (VIBRAMYCIN) 100 MG capsule Take 1 capsule (100 mg total) by mouth 2 (two) times daily. 02/15/21  Yes Roxan Hockey, [place] N, PA-C  EPIPEN 2-PAK 0.3 MG/0.3ML SOAJ injection Inject 0.3 mLs (0.3 mg total) into the muscle as needed. 12/18/19  Yes Rodriguez-Southworth, Nettie Elm, PA-C  Fluticasone-Salmeterol (ADVAIR DISKUS) 100-50 MCG/DOSE AEPB INHALE 1 PUFF BY MOUTH TWICE A DAY IN THE AM AND APPROXIMATELY 12 HOURS LATER 12/18/19  Yes Rodriguez-Southworth, Nettie Elm, PA-C  levocetirizine (XYZAL) 5 MG tablet Take 1 tablet (5 mg total) by mouth every evening. 02/20/19  Yes Dorothyann Peng, MD  Multiple Vitamin (MULTIVITAMIN WITH MINERALS) TABS Take 1 tablet by mouth daily.    [provider]  scopolamine (TRANSDERM-SCOP) 1 MG/3DAYS APPLY 1 TO HAIRLESS AREA BEHIND EAR EVERY 3 DAYS AT LEAST 4 HOURS BEFORE NEEDED EFFECT AS NEEDED 06/08/19   Dorothyann Peng, MD    Allergies    Bee venom  Review of Systems  Review of Systems  All other systems reviewed and are negative.   Physical Exam Updated Vital Signs BP 132/87   Pulse 70   Temp 97.9 F (36.6 C) (Oral)   Resp 19   Ht 6\' 1"  (1.854 m)   Wt 113.4 kg   SpO2 99%   BMI 32.98 kg/m   Physical Exam Vitals and nursing note reviewed.  Constitutional:      General: He is not in acute distress.    Appearance: He is well-developed. He is not ill-appearing.  HENT:     Head: Normocephalic and atraumatic.     Mouth/Throat:     Mouth: Mucous membranes are moist.     Pharynx: Oropharynx is clear.   Eyes:     Conjunctiva/sclera: Conjunctivae normal.  Cardiovascular:     Rate and Rhythm: Normal rate and regular rhythm.  Pulmonary:     Effort: Pulmonary effort is normal. No respiratory distress.     Breath sounds: Normal breath sounds.     Comments: Normal work of breathing Abdominal:     Palpations: Abdomen is soft.  Skin:    General: Skin is warm.  Neurological:     Mental Status: He is alert.  Psychiatric:        Behavior: Behavior normal.     ED Results / Procedures / Treatments   Labs (all labs ordered are listed, but only abnormal results are displayed) Labs Reviewed - No data to display  EKG None  Radiology DG Chest 2 View  Result Date: 02/15/2021 CLINICAL DATA:  40 year old male with cough. EXAM: CHEST - 2 VIEW COMPARISON:  Chest radiograph dated 06/10/2013. FINDINGS: The heart size and mediastinal contours are within normal limits. Both lungs are clear. The visualized skeletal structures are unremarkable. IMPRESSION: No active cardiopulmonary disease. Electronically Signed   By: 06/12/2013 M.D.   On: 02/15/2021 20:36    Procedures Procedures   Medications Ordered in ED Medications  doxycycline (VIBRA-TABS) tablet 100 mg (has no administration in time range)    ED Course  I have reviewed the triage vital signs and the nursing notes.  Pertinent labs & imaging results that were available during my care of the patient were reviewed by me and considered in my medical decision making (see chart for details).    MDM Rules/Calculators/A&P                          Patient with history of asthma and seasonal allergies, presenting with 4 weeks of cough that extremity, productive.  Lungs are clear, normal work of breathing, O2 sat is excellent on room air.  Chest x-ray is negative.,  Was tested for COVID during this and was negative for however given course of illness, will cover with antibiotic for CAP and recommend continue antihistamines and inhalers.  PCP  follow-up.  Patient is discharged in no distress.   Final Clinical Impression(s) / ED Diagnoses Final diagnoses:  Bronchitis    Rx / DC Orders ED Discharge Orders         Ordered    benzonatate (TESSALON) 100 MG capsule  3 times daily PRN        02/15/21 2049    doxycycline (VIBRAMYCIN) 100 MG capsule  2 times daily        02/15/21 2049           Robinson, 2050 N, PA-C 02/15/21 2051    2052, MD 02/15/21 2138

## 2021-02-20 ENCOUNTER — Encounter (HOSPITAL_BASED_OUTPATIENT_CLINIC_OR_DEPARTMENT_OTHER): Payer: Self-pay

## 2021-02-20 ENCOUNTER — Emergency Department (HOSPITAL_BASED_OUTPATIENT_CLINIC_OR_DEPARTMENT_OTHER)
Admission: EM | Admit: 2021-02-20 | Discharge: 2021-02-20 | Disposition: A | Discharge status: Home / Self Care | Payer: BC Managed Care – PPO | Attending: Emergency Medicine | Admitting: Emergency Medicine

## 2021-02-20 ENCOUNTER — Emergency Department (HOSPITAL_BASED_OUTPATIENT_CLINIC_OR_DEPARTMENT_OTHER): Payer: BC Managed Care – PPO

## 2021-02-20 ENCOUNTER — Other Ambulatory Visit: Payer: Self-pay

## 2021-02-20 DIAGNOSIS — S298XXA Other specified injuries of thorax, initial encounter: Secondary | ICD-10-CM

## 2021-02-20 DIAGNOSIS — Y9343 Activity, gymnastics: Secondary | ICD-10-CM | POA: Diagnosis not present

## 2021-02-20 DIAGNOSIS — Z7951 Long term (current) use of inhaled steroids: Secondary | ICD-10-CM | POA: Diagnosis not present

## 2021-02-20 DIAGNOSIS — W208XXA Other cause of strike by thrown, projected or falling object, initial encounter: Secondary | ICD-10-CM | POA: Diagnosis not present

## 2021-02-20 DIAGNOSIS — J4520 Mild intermittent asthma, uncomplicated: Secondary | ICD-10-CM | POA: Insufficient documentation

## 2021-02-20 DIAGNOSIS — Y92318 Other athletic court as the place of occurrence of the external cause: Secondary | ICD-10-CM | POA: Diagnosis not present

## 2021-02-20 DIAGNOSIS — S299XXA Unspecified injury of thorax, initial encounter: Secondary | ICD-10-CM | POA: Diagnosis not present

## 2021-02-20 MED ORDER — IBUPROFEN 800 MG PO TABS: 800.0000 mg | ORAL_TABLET | Freq: Three times a day (TID) | ORAL | 0 refills | Status: DC | PRN

## 2021-02-20 NOTE — ED Triage Notes (Signed)
Pt arrives with c/o pain in chest after dropping a bar with 225 lbs on his chest today at the gym, denies hitting head, denies LOC.

## 2021-02-20 NOTE — Discharge Instructions (Signed)
You were seen in the emergency department for blunt chest trauma.  He had a chest x-ray that did not show any obvious fractures.  You can try ice or heat to the affected area and we are giving a prescription for ibuprofen.  Please take with food on your stomach.  Return if any worsening or concerning symptoms.

## 2021-02-20 NOTE — ED Provider Notes (Signed)
[facility] EMERGENCY DEPARTMENT Provider Note   CSN: [id number] Arrival date & time: 02/20/21  1818     History Chief Complaint  Patient presents with  . Chest Injury    Brady Wells is a 40 y.o. male.  He is complaining of anterior chest wall pain after he was punching and the bar slipped out of his hand and 225 pounds struck him in the chest.  This happened a few hours ago.  Complaining of pain with deep breath twisting and turning.  Does not feel short of breath.  Has taken nothing for it.  No hemoptysis.  No abdominal pain.  The history is provided by the patient.  Chest Pain Pain location:  Substernal area, L chest and R chest Pain quality: stabbing   Pain radiates to:  Does not radiate Pain severity:  Moderate Onset quality:  Sudden Timing:  Intermittent Progression:  Unchanged Chronicity:  New Context: trauma   Relieved by:  None tried Worsened by:  Coughing, deep breathing and movement Ineffective treatments:  None tried Associated symptoms: no abdominal pain, no back pain, no cough, no diaphoresis, no nausea, no shortness of breath and no vomiting        Past Medical History:  Diagnosis Date  . Asthma   . Depression     Patient Active Problem List   Diagnosis Date Noted  . Mild intermittent asthma without complication 02/20/2019  . Seasonal allergic rhinitis due to pollen 02/20/2019  . Plantar fasciitis of left foot 09/10/2014  . Equinus deformity of foot, acquired 09/10/2014  . Metatarsal deformity 09/10/2014  . Onychomycosis 09/10/2014  . 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 APPENDECTOMY  1999       Family History  Problem Relation Age of Onset  . Healthy Mother   . Healthy Father     Social History   Tobacco Use  . Smoking status: Never Smoker  . Smokeless tobacco: Never Used  Vaping Use  . Vaping Use: Never used  Substance Use Topics  . Alcohol use: Yes    Comment:  socially  . Drug use: Never    Home Medications Prior to Admission medications   Medication Sig Start Date End Date Taking? Authorizing Provider  albuterol (VENTOLIN HFA) 108 (90 Base) MCG/ACT inhaler INHALE 2 PUFF BY INHALATION ROUTE EVERY 4 - 6 HOURS AS NEEDED AS NEEDED 12/18/19   Rodriguez-Southworth, Nettie Elm, PA-C  benzonatate (TESSALON) 100 MG capsule Take 1 capsule (100 mg total) by mouth 3 (three) times daily as needed for cough. 02/15/21   Robinson, [place] N, PA-C  Cholecalciferol (VITAMIN D3) 1.25 MG (50000 UT) CAPS TAKE 1 CAPSULE BY MOUTH ON TUESDAY AND FRIDAY 10/08/19   Dorothyann Peng, MD  doxycycline (VIBRAMYCIN) 100 MG capsule Take 1 capsule (100 mg total) by mouth 2 (two) times daily. 02/15/21   Robinson, [place] N, PA-C  EPIPEN 2-PAK 0.3 MG/0.3ML SOAJ injection Inject 0.3 mLs (0.3 mg total) into the muscle as needed. 12/18/19   Rodriguez-Southworth, Nettie Elm, PA-C  Fluticasone-Salmeterol (ADVAIR DISKUS) 100-50 MCG/DOSE AEPB INHALE 1 PUFF BY MOUTH TWICE A DAY IN THE AM AND APPROXIMATELY 12 HOURS LATER 12/18/19   Rodriguez-Southworth, Nettie Elm, PA-C  levocetirizine (XYZAL) 5 MG tablet Take 1 tablet (5 mg total) by mouth every evening. 02/20/19   Dorothyann Peng, MD  Multiple Vitamin (MULTIVITAMIN WITH MINERALS) TABS Take 1 tablet by mouth daily.    [provider]  scopolamine (TRANSDERM-SCOP) 1  MG/3DAYS APPLY 1 TO HAIRLESS AREA BEHIND EAR EVERY 3 DAYS AT LEAST 4 HOURS BEFORE NEEDED EFFECT AS NEEDED 06/08/19   Dorothyann Peng, MD    Allergies    Bee venom  Review of Systems   Review of Systems  Constitutional: Negative for diaphoresis.  Respiratory: Negative for cough and shortness of breath.   Cardiovascular: Positive for chest pain.  Gastrointestinal: Negative for abdominal pain, nausea and vomiting.  Musculoskeletal: Negative for back pain and neck pain.    Physical Exam Updated Vital Signs BP (!) 144/74 (BP Location: Right Arm)   Pulse (!) 102   Temp 97.8 F (36.6 C)  (Oral)   Resp 16   Ht 6\' 1"  (1.854 m)   Wt 113.4 kg   SpO2 100%   BMI 32.98 kg/m   Physical Exam Vitals and nursing note reviewed.  Constitutional:      Appearance: Normal appearance. He is well-developed.  HENT:     Head: Normocephalic and atraumatic.  Eyes:     Conjunctiva/sclera: Conjunctivae normal.  Cardiovascular:     Rate and Rhythm: Normal rate and regular rhythm.     Pulses: Normal pulses.  Pulmonary:     Effort: Pulmonary effort is normal.  Chest:     Chest wall: Tenderness present. No crepitus.    Abdominal:     Tenderness: There is no abdominal tenderness. There is no guarding or rebound.  Musculoskeletal:        General: No deformity or signs of injury. Normal range of motion.     Cervical back: Neck supple.  Skin:    General: Skin is warm and dry.  Neurological:     General: No focal deficit present.     Mental Status: He is alert.     GCS: GCS eye subscore is 4. GCS verbal subscore is 5. GCS motor subscore is 6.     ED Results / Procedures / Treatments   Labs (all labs ordered are listed, but only abnormal results are displayed) Labs Reviewed - No data to display  EKG None  Radiology DG Chest 2 View  Result Date: 02/20/2021 CLINICAL DATA:  Dropped a 225 pound bar on chest today at the gym, injury, pain EXAM: CHEST - 2 VIEW COMPARISON:  02/15/2021 FINDINGS: Normal heart size, mediastinal contours, and pulmonary vascularity. Lungs clear. No pleural effusion or pneumothorax. Bones unremarkable. No fractures identified. IMPRESSION: Normal exam. Electronically Signed   By: 02/17/2021 M.D.   On: 02/20/2021 18:57    Procedures Procedures   Medications Ordered in ED Medications - No data to display  ED Course  I have reviewed the triage vital signs and the nursing notes.  Pertinent labs & imaging results that were available during my care of the patient were reviewed by me and considered in my medical decision making (see chart for details).     MDM Rules/Calculators/A&P                         40 year old male here with blunt chest trauma.  Differential includes contusion, sternal fracture, rib fractures, pneumothorax.  Hemodynamically stable and clinically appears very comfortable.  Chest x-ray does not show any acute findings.  Recommended symptomatic treatment with ice heat as needed and anti-inflammatories.  Return instructions discussed Final Clinical Impression(s) / ED Diagnoses Final diagnoses:  Blunt trauma to chest, initial encounter    Rx / DC Orders ED Discharge Orders  Ordered    ibuprofen (ADVIL) 800 MG tablet  Every 8 hours PRN        02/20/21 1938           Terrilee Files, MD 02/21/21 1037

## 2021-02-23 ENCOUNTER — Telehealth: Payer: Self-pay

## 2021-02-23 NOTE — Telephone Encounter (Signed)
I called and left pt vm to call the office so we can schedule him an ER f/u with Raman per RS. Marijo Sanes

## 2021-03-26 ENCOUNTER — Ambulatory Visit: Payer: BC Managed Care – PPO | Admitting: Nurse Practitioner

## 2021-03-26 ENCOUNTER — Other Ambulatory Visit: Payer: Self-pay | Admitting: Nurse Practitioner

## 2021-03-26 ENCOUNTER — Other Ambulatory Visit: Payer: Self-pay

## 2021-03-26 VITALS — Temp 98.2°F | Ht 68.2 in | Wt 233.4 lb

## 2021-03-26 DIAGNOSIS — E559 Vitamin D deficiency, unspecified: Secondary | ICD-10-CM | POA: Diagnosis not present

## 2021-03-26 DIAGNOSIS — R635 Abnormal weight gain: Secondary | ICD-10-CM | POA: Diagnosis not present

## 2021-03-26 DIAGNOSIS — R7303 Prediabetes: Secondary | ICD-10-CM

## 2021-03-26 DIAGNOSIS — Z0001 Encounter for general adult medical examination with abnormal findings: Secondary | ICD-10-CM

## 2021-03-26 DIAGNOSIS — E6609 Other obesity due to excess calories: Secondary | ICD-10-CM

## 2021-03-26 DIAGNOSIS — Z6835 Body mass index (BMI) 35.0-35.9, adult: Secondary | ICD-10-CM

## 2021-03-26 DIAGNOSIS — Z13228 Encounter for screening for other metabolic disorders: Secondary | ICD-10-CM

## 2021-03-26 DIAGNOSIS — E66812 Obesity, class 2: Secondary | ICD-10-CM

## 2021-03-26 MED ORDER — LEVOCETIRIZINE DIHYDROCHLORIDE 5 MG PO TABS: 5.0000 mg | ORAL_TABLET | Freq: Every evening | ORAL | 1 refills | Status: DC

## 2021-03-26 MED ORDER — FLUTICASONE-SALMETEROL 100-50 MCG/ACT IN AEPB: 1.0000 | INHALATION_SPRAY | Freq: Two times a day (BID) | RESPIRATORY_TRACT | 2 refills | Status: DC

## 2021-03-26 MED ORDER — SCOPOLAMINE 1 MG/3DAYS TD PT72: MEDICATED_PATCH | TRANSDERMAL | 1 refills | Status: DC

## 2021-03-26 MED ORDER — ALBUTEROL SULFATE HFA 108 (90 BASE) MCG/ACT IN AERS: INHALATION_SPRAY | RESPIRATORY_TRACT | 3 refills | Status: DC

## 2021-03-26 MED ORDER — EPIPEN 2-PAK 0.3 MG/0.3ML IJ SOAJ: 0.3000 mg | INTRAMUSCULAR | 3 refills | Status: DC | PRN

## 2021-03-26 NOTE — Patient Instructions (Signed)

## 2021-03-26 NOTE — Progress Notes (Signed)
I,Tianna Badgett,acting as a Education administrator for [employer], NP.,have documented all relevant documentation on the behalf of [employer], NP,as directed by  Bary Castilla, NP while in the presence of Bary Castilla, NP.  This visit occurred during the SARS-CoV-2 public health emergency.  Safety protocols were in place, including screening questions prior to the visit, additional usage of staff PPE, and extensive cleaning of exam room while observing appropriate contact time as indicated for disinfecting solutions.  Subjective:     Patient ID: Brady Wells , male    DOB: [birth year] , 40 y.o.   MRN: [id number]   Chief Complaint  Patient presents with  . Annual Exam    HPI  Patient is here for physical exam. He is compliant with medications. He wants to see if he can get something for weight loss.  Exercise/Diet: He is allergic to wheat. He is trying to eat pretty healthy. He wants to be put on something for weight loss. We will check his blood work and see if he can be put on [facility] for weight loss. He denies history of thyroid cancer and pancreatitis.  Smoke: No smoke.  Drink: occasionally   Wt Readings from Last 3 Encounters: 03/26/21 : 233 lb 6.4 oz (105.9 kg) 02/20/21 : 250 lb (113.4 kg) 02/15/21 : 250 lb (113.4 kg)      Past Medical History:  Diagnosis Date  . Asthma   . Depression      Family History  Problem Relation Age of Onset  . Healthy Mother   . Healthy Father      Current Outpatient Medications:  .  albuterol (VENTOLIN HFA) 108 (90 Base) MCG/ACT inhaler, INHALE 2 PUFF BY INHALATION ROUTE EVERY 4 - 6 HOURS AS NEEDED AS NEEDED, Disp: 6.7 g, Rfl: 3 .  EPIPEN 2-PAK 0.3 MG/0.3ML SOAJ injection, Inject 0.3 mg into the muscle as needed., Disp: 2 each, Rfl: 3 .  fluticasone-salmeterol (ADVAIR DISKUS) 100-50 MCG/ACT AEPB, Inhale 1 puff into the lungs 2 (two) times daily., Disp: 60 each, Rfl: 2 .  Fluticasone-Salmeterol (ADVAIR DISKUS) 100-50 MCG/DOSE  AEPB, INHALE 1 PUFF BY MOUTH TWICE A DAY IN THE AM AND APPROXIMATELY 12 HOURS LATER, Disp: 180 each, Rfl: 3 .  ibuprofen (ADVIL) 800 MG tablet, Take 1 tablet (800 mg total) by mouth every 8 (eight) hours as needed., Disp: 30 tablet, Rfl: 0 .  levocetirizine (XYZAL) 5 MG tablet, Take 1 tablet (5 mg total) by mouth every evening., Disp: 90 tablet, Rfl: 1 .  Multiple Vitamin (MULTIVITAMIN WITH MINERALS) TABS, Take 1 tablet by mouth daily., Disp: , Rfl:  .  scopolamine (TRANSDERM-SCOP) 1 MG/3DAYS, APPLY 1 TO HAIRLESS AREA BEHIND EAR EVERY 3 DAYS AT LEAST 4 HOURS BEFORE NEEDED EFFECT AS NEEDED, Disp: 24 patch, Rfl: 1   Allergies  Allergen Reactions  . Bee Venom Swelling     Men's preventive visit. Patient Health Questionnaire (PHQ-2) is  [place] Office Visit from 03/26/2021 in [facility]  PHQ-2 Total Score 0    . Patient is on not a diet. Marital status: Single. Relevant history for alcohol use is:  Social History   Substance and Sexual Activity  Alcohol Use Yes   Comment: socially  . Relevant history for tobacco use is:  Social History   Tobacco Use  Smoking Status Never Smoker  Smokeless Tobacco Never Used  .   Review of Systems  Constitutional: Positive for unexpected weight change. Negative for chills and fever.  HENT: Negative.  Negative for congestion, ear pain, hearing loss and rhinorrhea.   Eyes: Negative.   Respiratory: Negative.  Negative for cough, choking and wheezing.   Cardiovascular: Negative.  Negative for chest pain and palpitations.  Gastrointestinal: Negative.  Negative for constipation, diarrhea, nausea and vomiting.  Endocrine: Negative.   Genitourinary: Negative.  Negative for urgency.  Musculoskeletal: Negative.  Negative for arthralgias and myalgias.  Skin: Negative.   Allergic/Immunologic: Negative.   Neurological: Negative.  Negative for dizziness, weakness, numbness and headaches.  Hematological: Negative.    Psychiatric/Behavioral: Negative.   All other systems reviewed and are negative.    Today's Vitals   03/26/21 1144  Temp: 98.2 F (36.8 C)  TempSrc: Oral  Weight: 233 lb 6.4 oz (105.9 kg)  Height: 5' 8.2" (1.732 m)   Body mass index is 35.28 kg/m.   Objective:  Physical Exam Vitals and nursing note reviewed.  Constitutional:      Appearance: Normal appearance. He is obese.  HENT:     Head: Normocephalic and atraumatic.     Right Ear: Tympanic membrane, ear canal and external ear normal. There is no impacted cerumen.     Left Ear: Tympanic membrane, ear canal and external ear normal. There is no impacted cerumen.     Nose: Nose normal. No congestion.     Mouth/Throat:     Mouth: Mucous membranes are moist.     Pharynx: Oropharynx is clear.  Eyes:     Extraocular Movements: Extraocular movements intact.     Conjunctiva/sclera: Conjunctivae normal.     Pupils: Pupils are equal, round, and reactive to light.  Cardiovascular:     Rate and Rhythm: Normal rate and regular rhythm.     Pulses: Normal pulses.     Heart sounds: Normal heart sounds. No murmur heard.   Pulmonary:     Effort: Pulmonary effort is normal. No respiratory distress.     Breath sounds: Normal breath sounds. No wheezing.  Chest:  Breasts:     Right: Normal. No swelling, bleeding, inverted nipple, mass or nipple discharge.     Left: Normal. No swelling, bleeding, inverted nipple, mass or nipple discharge.    Abdominal:     General: Abdomen is flat. Bowel sounds are normal.     Palpations: Abdomen is soft.  Genitourinary:    Comments: Deferred  Musculoskeletal:        General: Normal range of motion.     Cervical back: Normal range of motion and neck supple.  Skin:    General: Skin is warm and dry.     Capillary Refill: Capillary refill takes less than 2 seconds.  Neurological:     General: No focal deficit present.     Mental Status: He is alert and oriented to person, place, and time.   Psychiatric:        Mood and Affect: Mood normal.        Behavior: Behavior normal.        Thought Content: Thought content normal.        Judgment: Judgment normal.         Assessment And Plan:    1. Encounter for general adult medical examination with abnormal findings --Patient is here for their annual physical exam and we discussed any changes to medication and medical history.  -Behavior modification was discussed as well as diet and exercise history  -Patient will continue to exercise regularly and modify their diet.  -Recommendation for yearly physical annuals, immunization and screenings including  mammogram and colonoscopy were discussed with the patient.  -Recommended intake of multivitamin, vitamin D and calcium.  -Individualized advise was given to the patient pertaining to their own health history in regards to diet, exercise, medical condition and referrals.  - CBC - CMP14+EGFR - Lipid panel  2. Prediabetes -Will check Hgb A1c and assess.  - Hemoglobin A1c  3. Vitamin D deficiency -Will check and supplement. Advised patient to spend 15 min. In the sun.   VITAMIN D 25 Hydroxy (Vit-D Deficiency, Fractures)  4. Weight gain -Will check his thyroid level and assess. He has been working out and exercising but doesn't know why he is gaining weight.  - TSH + free T4  5. Encounter for screening for metabolic disorder -Will check his thyroid and insulin level.  - TSH + free T4 - Insulin, random(561)  6. Class 2 obesity due to excess calories without serious comorbidity with body mass index (BMI) of 35.0 to 35.9 in adult -Educated patient to eat a healthy well balanced diet full of green vegetables and fruits. Avoid red meats and fast foods. Exercise 30-45 min. daily.   Patient was given opportunity to ask questions. Patient verbalized understanding of the plan and was able to repeat key elements of the plan. All questions were answered to their satisfaction.    Bary Castilla, DNP   I, Bary Castilla, DNP have reviewed all documentation for this visit. The documentation on 03/26/21  for the exam, diagnosis, procedures, and orders are all accurate and complete.   THE PATIENT IS ENCOURAGED TO PRACTICE SOCIAL DISTANCING DUE TO THE COVID-19 PANDEMIC.

## 2021-03-27 ENCOUNTER — Other Ambulatory Visit: Payer: Self-pay

## 2021-03-27 LAB — CBC
HCT: 46.3 % (ref 38.5–50.0)
Hemoglobin: 15.1 g/dL (ref 13.2–17.1)
MCH: 25.5 pg — ABNORMAL LOW (ref 27.0–33.0)
MCHC: 32.6 g/dL (ref 32.0–36.0)
MCV: 78.2 fL — ABNORMAL LOW (ref 80.0–100.0)
MPV: 9.6 fL (ref 7.5–12.5)
Platelets: 327 10*3/uL (ref 140–400)
RBC: 5.92 10*6/uL — ABNORMAL HIGH (ref 4.20–5.80)
RDW: 14.4 % (ref 11.0–15.0)
WBC: 5.7 10*3/uL (ref 3.8–10.8)

## 2021-03-27 LAB — HEMOGLOBIN A1C
Hgb A1c MFr Bld: 6.4 % of total Hgb — ABNORMAL HIGH (ref <5.7)
Mean Plasma Glucose: 137 mg/dL
eAG (mmol/L): 7.6 mmol/L

## 2021-03-27 LAB — VITAMIN D 25 HYDROXY (VIT D DEFICIENCY, FRACTURES): Vit D, 25-Hydroxy: 37 ng/mL (ref 30–100)

## 2021-03-27 LAB — LIPID PANEL
Cholesterol: 182 mg/dL (ref <200)
HDL: 57 mg/dL (ref >=40)
LDL Cholesterol (Calc): 95 mg/dL (calc)
Non-HDL Cholesterol (Calc): 125 mg/dL (calc) (ref <130)
Total CHOL/HDL Ratio: 3.2 (calc) (ref <5.0)
Triglycerides: 206 mg/dL — ABNORMAL HIGH (ref <150)

## 2021-03-27 MED ORDER — PROVENTIL HFA 108 (90 BASE) MCG/ACT IN AERS: INHALATION_SPRAY | RESPIRATORY_TRACT | 2 refills | Status: DC

## 2021-03-28 LAB — CMP14+EGFR
ALT: 40 IU/L (ref 0–44)
AST: 30 IU/L (ref 0–40)
Albumin/Globulin Ratio: 1.3 (ref 1.2–2.2)
Albumin: 4.4 g/dL (ref 4.0–5.0)
Alkaline Phosphatase: 68 IU/L (ref 44–121)
BUN/Creatinine Ratio: 10 (ref 9–20)
BUN: 11 mg/dL (ref 6–20)
Bilirubin Total: 0.2 mg/dL (ref 0.0–1.2)
CO2: 18 mmol/L — ABNORMAL LOW (ref 20–29)
Calcium: 9.9 mg/dL (ref 8.7–10.2)
Chloride: 100 mmol/L (ref 96–106)
Creatinine, Ser: 1.09 mg/dL (ref 0.76–1.27)
Globulin, Total: 3.3 g/dL (ref 1.5–4.5)
Glucose: 115 mg/dL — ABNORMAL HIGH (ref 65–99)
Potassium: 4.1 mmol/L (ref 3.5–5.2)
Sodium: 140 mmol/L (ref 134–144)
Total Protein: 7.7 g/dL (ref 6.0–8.5)
eGFR: 89 mL/min/{1.73_m2} (ref >59)

## 2021-03-28 LAB — TSH+FREE T4
Free T4: 0.99 ng/dL (ref 0.82–1.77)
TSH: 3.91 u[IU]/mL (ref 0.450–4.500)

## 2021-03-28 LAB — INSULIN, RANDOM: INSULIN: 82.3 u[IU]/mL — ABNORMAL HIGH (ref 2.6–24.9)

## 2021-04-01 ENCOUNTER — Other Ambulatory Visit: Payer: Self-pay

## 2021-04-01 MED ORDER — OZEMPIC (0.25 OR 0.5 MG/DOSE) 2 MG/1.5ML ~~LOC~~ SOPN: 0.5000 mg | PEN_INJECTOR | SUBCUTANEOUS | 3 refills | Status: DC

## 2021-04-02 ENCOUNTER — Other Ambulatory Visit: Payer: Self-pay

## 2021-04-02 ENCOUNTER — Ambulatory Visit: Payer: BC Managed Care – PPO

## 2021-04-02 DIAGNOSIS — R7303 Prediabetes: Secondary | ICD-10-CM

## 2021-04-02 NOTE — Progress Notes (Signed)
Patient is here for ozempic teaching. Patient administered first dose.

## 2021-05-20 ENCOUNTER — Other Ambulatory Visit: Payer: Self-pay

## 2021-05-20 ENCOUNTER — Ambulatory Visit: Payer: BC Managed Care – PPO | Admitting: Nurse Practitioner

## 2021-05-20 VITALS — BP 132/78 | HR 74 | Temp 98.3°F | Ht 70.0 in | Wt 248.6 lb

## 2021-05-20 DIAGNOSIS — E6609 Other obesity due to excess calories: Secondary | ICD-10-CM | POA: Diagnosis not present

## 2021-05-20 DIAGNOSIS — Z6835 Body mass index (BMI) 35.0-35.9, adult: Secondary | ICD-10-CM | POA: Diagnosis not present

## 2021-05-20 DIAGNOSIS — M25562 Pain in left knee: Secondary | ICD-10-CM

## 2021-05-20 MED ORDER — LEVOCETIRIZINE DIHYDROCHLORIDE 5 MG PO TABS: 5.0000 mg | ORAL_TABLET | Freq: Every evening | ORAL | 1 refills | Status: DC

## 2021-05-20 MED ORDER — FLUTICASONE-SALMETEROL 100-50 MCG/ACT IN AEPB: 1.0000 | INHALATION_SPRAY | Freq: Two times a day (BID) | RESPIRATORY_TRACT | 2 refills | Status: DC

## 2021-05-20 MED ORDER — EPIPEN 2-PAK 0.3 MG/0.3ML IJ SOAJ: 0.3000 mg | INTRAMUSCULAR | 3 refills | Status: DC | PRN

## 2021-05-20 MED ORDER — ALBUTEROL SULFATE HFA 108 (90 BASE) MCG/ACT IN AERS: INHALATION_SPRAY | RESPIRATORY_TRACT | 3 refills | Status: DC

## 2021-05-20 MED ORDER — OZEMPIC (0.25 OR 0.5 MG/DOSE) 2 MG/1.5ML ~~LOC~~ SOPN: 0.5000 mg | PEN_INJECTOR | SUBCUTANEOUS | 3 refills | Status: DC

## 2021-05-20 NOTE — Progress Notes (Signed)
I,Tianna Badgett,acting as a Neurosurgeon for [employer], NP.,have documented all relevant documentation on the behalf of [employer], NP,as directed by  Charlesetta Ivory, NP while in the presence of Charlesetta Ivory, NP.  This visit occurred during the SARS-CoV-2 public health emergency.  Safety protocols were in place, including screening questions prior to the visit, additional usage of staff PPE, and extensive cleaning of exam room while observing appropriate contact time as indicated for disinfecting solutions.  Subjective:     Patient ID: Brady Wells , male    DOB: [birth year] , 40 y.o.   MRN: [id number]   Chief Complaint  Patient presents with   Obesity    HPI  Today he is 248. Last time 252 lbs.  He is cutting back on foods and working out with cardio. He is running outside. He is running 2-4 miles.  Food: he is eating healthy.     Past Medical History:  Diagnosis Date   Asthma    Depression      Family History  Problem Relation Age of Onset   Healthy Mother    Healthy Father      Current Outpatient Medications:    albuterol (VENTOLIN HFA) 108 (90 Base) MCG/ACT inhaler, INHALE 2 PUFF BY INHALATION ROUTE EVERY 4 - 6 HOURS AS NEEDED AS NEEDED, Disp: 18 g, Rfl: 3   EPIPEN 2-PAK 0.3 MG/0.3ML SOAJ injection, Inject 0.3 mg into the muscle as needed., Disp: 2 each, Rfl: 3   fluticasone-salmeterol (ADVAIR DISKUS) 100-50 MCG/ACT AEPB, Inhale 1 puff into the lungs 2 (two) times daily., Disp: 120 each, Rfl: 2   Fluticasone-Salmeterol (ADVAIR DISKUS) 100-50 MCG/DOSE AEPB, INHALE 1 PUFF BY MOUTH TWICE A DAY IN THE AM AND APPROXIMATELY 12 HOURS LATER, Disp: 180 each, Rfl: 3   ibuprofen (ADVIL) 800 MG tablet, Take 1 tablet (800 mg total) by mouth every 8 (eight) hours as needed., Disp: 30 tablet, Rfl: 0   levocetirizine (XYZAL) 5 MG tablet, Take 1 tablet (5 mg total) by mouth every evening., Disp: 90 tablet, Rfl: 1   Multiple Vitamin (MULTIVITAMIN WITH MINERALS) TABS,  Take 1 tablet by mouth daily., Disp: , Rfl:    scopolamine (TRANSDERM-SCOP) 1 MG/3DAYS, APPLY 1 TO HAIRLESS AREA BEHIND EAR EVERY 3 DAYS AT LEAST 4 HOURS BEFORE NEEDED EFFECT AS NEEDED, Disp: 24 patch, Rfl: 1   Semaglutide,0.25 or 0.5MG /DOS, (OZEMPIC, 0.25 OR 0.5 MG/DOSE,) 2 MG/1.5ML SOPN, Inject 0.5 mg into the skin once a week., Disp: 9 mL, Rfl: 3   Allergies  Allergen Reactions   Bee Venom Swelling     Review of Systems  Constitutional: Negative.  Negative for chills and fatigue.  HENT:  Negative for rhinorrhea and sinus pain.   Respiratory: Negative.  Negative for cough, shortness of breath and wheezing.   Cardiovascular: Negative.  Negative for chest pain and palpitations.  Gastrointestinal: Negative.  Negative for nausea and vomiting.  Musculoskeletal:  Positive for arthralgias. Negative for joint swelling and myalgias.       Left acute knee pain due to running   Neurological: Negative.  Negative for dizziness, weakness and numbness.    Today's Vitals   05/20/21 0924  BP: 132/78  Pulse: 74  Temp: 98.3 F (36.8 C)  TempSrc: Oral  Weight: 248 lb 9.6 oz (112.8 kg)  Height: 5\' 10"  (1.778 m)   Body mass index is 35.67 kg/m.  Wt Readings from Last 3 Encounters:  05/20/21 248 lb 9.6 oz (112.8 kg)  03/26/21 233 lb 6.4  oz (105.9 kg)  02/20/21 250 lb (113.4 kg)    Objective:  Physical Exam Constitutional:      Appearance: Normal appearance. He is obese.  Cardiovascular:     Rate and Rhythm: Normal rate and regular rhythm.     Pulses: Normal pulses.     Heart sounds: No murmur heard. Pulmonary:     Effort: Pulmonary effort is normal. No respiratory distress.     Breath sounds: Normal breath sounds.  Musculoskeletal:        General: No swelling.  Skin:    General: Skin is warm.     Capillary Refill: Capillary refill takes less than 2 seconds.  Neurological:     Mental Status: He is alert and oriented to person, place, and time.        Assessment And Plan:     1.  Class 2 obesity due to excess calories without serious comorbidity with body mass index (BMI) of 35.0 to 35.9 in adult -Ambulatory referral to Physical Therapy   He is here for a weight check. He is doing well with the ozempic.  -No concerns or side-effect.  -Refilled ozempic -Continue exercise and a healthy diet   2. Acute pain of left knee - Ambulatory referral to Physical Therapy -Advised patient to use heat/cold pads for pain  -Advised patient to use brace and OTC volteran gel as needed for the pain.  -Educated patient on other treatment options such as orthopedic, steroid injection.  -Follow up if pain gets worse.   Side effects and appropriate use of all the medication(s) were discussed with the patient today. Patient advised to use the medication(s) as directed by their healthcare provider. The patient was encouraged to read, review, and understand all associated package inserts and contact our office with any questions or concerns. The patient accepts the risks of the treatment plan and had an opportunity to ask questions.    The patient was encouraged to call or send a message through MyChart for any questions or concerns.   Follow up: if symptoms persist or do not get better and 3 months of weight check.   Patient was given opportunity to ask questions. Patient verbalized understanding of the plan and was able to repeat key elements of the plan. All questions were answered to their satisfaction.  Raman Toni Hoffmeister, DNP   I, Raman Momin Misko have reviewed all documentation for this visit. The documentation on 05/20/21 for the exam, diagnosis, procedures, and orders are all accurate and complete.    IF YOU HAVE BEEN REFERRED TO A SPECIALIST, IT MAY TAKE 1-2 WEEKS TO SCHEDULE/PROCESS THE REFERRAL. IF YOU HAVE NOT HEARD FROM US/SPECIALIST IN TWO WEEKS, PLEASE GIVE [place] A CALL AT [phone] X [phone].   THE PATIENT IS ENCOURAGED TO PRACTICE SOCIAL DISTANCING DUE TO THE COVID-19 PANDEMIC.

## 2021-05-20 NOTE — Patient Instructions (Signed)
Obesity, Adult Obesity is having too much body fat. Being obese means that your weight is morethan what is healthy for you. BMI is a number that explains how much body fat you have. If you have a BMI of 30 or more, you are obese. Obesity is often caused by eating or drinking morecalories than your body uses. Changing your lifestyle can help you lose weight. Obesity can cause serious health problems, such as: Stroke. Coronary artery disease (CAD). Type 2 diabetes. Some types of cancer, including cancers of the colon, breast, uterus, and gallbladder. Osteoarthritis. High blood pressure (hypertension). High cholesterol. Sleep apnea. Gallbladder stones. Infertility problems. What are the causes? Eating meals each day that are high in calories, sugar, and fat. Being born with genes that may make you more likely to become obese. Having a medical condition that causes obesity. Taking certain medicines. Sitting a lot (having a sedentary lifestyle). Not getting enough sleep. Drinking a lot of drinks that have sugar in them. What increases the risk? Having a family history of obesity. Being an African American woman. Being a Hispanic man. Living in an area with limited access to: Parks, recreation centers, or sidewalks. Healthy food choices, such as grocery stores and farmers' markets. What are the signs or symptoms? The main sign is having too much body fat. How is this treated? Treatment for this condition often includes changing your lifestyle. Treatment may include: Changing your diet. This may include making a healthy meal plan. Exercise. This may include activity that causes your heart to beat faster (aerobic exercise) and strength training. Work with your doctor to design a program that works for you. Medicine to help you lose weight. This may be used if you are not able to lose 1 pound a week after 6 weeks of healthy eating and more exercise. Treating conditions that cause the  obesity. Surgery. Options may include gastric banding and gastric bypass. This may be done if: Other treatments have not helped to improve your condition. You have a BMI of 40 or higher. You have life-threatening health problems related to obesity. Follow these instructions at home: Eating and drinking  Follow advice from your doctor about what to eat and drink. Your doctor may tell you to: Limit fast food, sweets, and processed snack foods. Choose low-fat options. For example, choose low-fat milk instead of whole milk. Eat 5 or more servings of fruits or vegetables each day. Eat at home more often. This gives you more control over what you eat. Choose healthy foods when you eat out. Learn to read food labels. This will help you learn how much food is in 1 serving. Keep low-fat snacks available. Avoid drinks that have a lot of sugar in them. These include soda, fruit juice, iced tea with sugar, and flavored milk. Drink enough water to keep your pee (urine) pale yellow. Do not go on fad diets.  Physical activity Exercise often, as told by your doctor. Most adults should get up to 150 minutes of moderate-intensity exercise every week.Ask your doctor: What types of exercise are safe for you. How often you should exercise. Warm up and stretch before being active. Do slow stretching after being active (cool down). Rest between times of being active. Lifestyle Work with your doctor and a food expert (dietitian) to set a weight-loss goal that is best for you. Limit your screen time. Find ways to reward yourself that do not involve food. Do not drink alcohol if: Your doctor tells you not to drink.   You are pregnant, may be pregnant, or are planning to become pregnant. If you drink alcohol: Limit how much you use to: 0-1 drink a day for women. 0-2 drinks a day for men. Be aware of how much alcohol is in your drink. In the [place] one drink equals one 12 oz bottle of beer (355 mL), one 5 oz  glass of wine (148 mL), or one 1 oz glass of hard liquor (44 mL). General instructions Keep a weight-loss journal. This can help you keep track of: The food that you eat. How much exercise you get. Take over-the-counter and prescription medicines only as told by your doctor. Take vitamins and supplements only as told by your doctor. Think about joining a support group. Keep all follow-up visits as told by your doctor. This is important. Contact a doctor if: You cannot meet your weight loss goal after you have changed your diet and lifestyle for 6 weeks. Get help right away if you: Are having trouble breathing. Are having thoughts of harming yourself. Summary Obesity is having too much body fat. Being obese means that your weight is more than what is healthy for you. Work with your doctor to set a weight-loss goal. Get regular exercise as told by your doctor. This information is not intended to replace advice given to you by your health care provider. Make sure you discuss any questions you have with your healthcare provider. Document Revised: 07/20/2018 Document Reviewed: 07/20/2018 Elsevier Patient Education  2022 Elsevier Inc.  

## 2021-07-01 ENCOUNTER — Other Ambulatory Visit: Payer: Self-pay | Admitting: Nurse Practitioner

## 2021-07-01 ENCOUNTER — Other Ambulatory Visit: Payer: Self-pay

## 2021-07-01 MED ORDER — FLUTICASONE-SALMETEROL 100-50 MCG/ACT IN AEPB: 1.0000 | INHALATION_SPRAY | Freq: Two times a day (BID) | RESPIRATORY_TRACT | 2 refills | Status: DC

## 2021-07-01 MED ORDER — OZEMPIC (0.25 OR 0.5 MG/DOSE) 2 MG/1.5ML ~~LOC~~ SOPN: 0.5000 mg | PEN_INJECTOR | SUBCUTANEOUS | 3 refills | Status: DC

## 2021-07-02 ENCOUNTER — Telehealth: Payer: Self-pay

## 2021-07-02 NOTE — Telephone Encounter (Signed)
I called the pt to notify him that Raman, DNP. FNP, BC said to come pickup a  sample of the ozempic because the medication is on backorder

## 2021-08-26 ENCOUNTER — Ambulatory Visit (INDEPENDENT_AMBULATORY_CARE_PROVIDER_SITE_OTHER): Payer: BC Managed Care – PPO | Admitting: Nurse Practitioner

## 2021-08-26 ENCOUNTER — Encounter: Payer: Self-pay | Admitting: Nurse Practitioner

## 2021-08-26 ENCOUNTER — Other Ambulatory Visit: Payer: Self-pay

## 2021-08-26 VITALS — BP 114/72 | HR 63 | Temp 98.3°F | Ht 70.2 in | Wt 246.2 lb

## 2021-08-26 DIAGNOSIS — E6609 Other obesity due to excess calories: Secondary | ICD-10-CM | POA: Diagnosis not present

## 2021-08-26 DIAGNOSIS — R7303 Prediabetes: Secondary | ICD-10-CM

## 2021-08-26 DIAGNOSIS — Z6835 Body mass index (BMI) 35.0-35.9, adult: Secondary | ICD-10-CM

## 2021-08-26 DIAGNOSIS — Z2821 Immunization not carried out because of patient refusal: Secondary | ICD-10-CM | POA: Diagnosis not present

## 2021-08-26 DIAGNOSIS — E559 Vitamin D deficiency, unspecified: Secondary | ICD-10-CM

## 2021-08-26 MED ORDER — OZEMPIC (1 MG/DOSE) 4 MG/3ML ~~LOC~~ SOPN
1.0000 mg | PEN_INJECTOR | SUBCUTANEOUS | 2 refills | Status: DC
Start: 2021-08-26 — End: 2021-11-27

## 2021-08-26 NOTE — Patient Instructions (Signed)

## 2021-08-26 NOTE — Progress Notes (Signed)
I,Jameka J Llittleton,acting as a Education administrator for [employer], NP.,have documented all relevant documentation on the behalf of [employer], NP,as directed by  Bary Castilla, NP while in the presence of Bary Castilla, NP.  This visit occurred during the SARS-CoV-2 public health emergency.  Safety protocols were in place, including screening questions prior to the visit, additional usage of staff PPE, and extensive cleaning of exam room while observing appropriate contact time as indicated for disinfecting solutions.  Subjective:     Patient ID: Brady Wells , male    DOB: 07/22/1981 , 40 y.o.   MRN: [id number]   Chief Complaint  Patient presents with   Weight Check    HPI  Patient presents today for a weight check. He has no other concern. Will increase his ozempic to 1 mg. He is tolerating the 0.5 once weekly.  Wt Readings from Last 3 Encounters: 08/26/21 : 246 lb 3.2 oz (111.7 kg) 05/20/21 : 248 lb 9.6 oz (112.8 kg) 03/26/21 : 233 lb 6.4 oz (105.9 kg)  Diet: Green vegetables. He is trying to avoid red meats. He does enjoy eating steak.  Exercise: he is exercising. He is running. He runs about 4 miles.    Past Medical History:  Diagnosis Date   Asthma    Depression      Family History  Problem Relation Age of Onset   Healthy Mother    Healthy Father      Current Outpatient Medications:    albuterol (VENTOLIN HFA) 108 (90 Base) MCG/ACT inhaler, INHALE 2 PUFF BY INHALATION ROUTE EVERY 4 - 6 HOURS AS NEEDED AS NEEDED, Disp: 18 g, Rfl: 3   EPIPEN 2-PAK 0.3 MG/0.3ML SOAJ injection, Inject 0.3 mg into the muscle as needed., Disp: 2 each, Rfl: 3   fluticasone-salmeterol (ADVAIR DISKUS) 100-50 MCG/ACT AEPB, Inhale 1 puff into the lungs 2 (two) times daily., Disp: 120 each, Rfl: 2   ibuprofen (ADVIL) 800 MG tablet, Take 1 tablet (800 mg total) by mouth every 8 (eight) hours as needed., Disp: 30 tablet, Rfl: 0   levocetirizine (XYZAL) 5 MG tablet, Take 1 tablet (5  mg total) by mouth every evening., Disp: 90 tablet, Rfl: 1   Multiple Vitamin (MULTIVITAMIN WITH MINERALS) TABS, Take 1 tablet by mouth daily., Disp: , Rfl:    scopolamine (TRANSDERM-SCOP) 1 MG/3DAYS, APPLY 1 TO HAIRLESS AREA BEHIND EAR EVERY 3 DAYS AT LEAST 4 HOURS BEFORE NEEDED EFFECT AS NEEDED, Disp: 24 patch, Rfl: 1   Semaglutide, 1 MG/DOSE, (OZEMPIC, 1 MG/DOSE,) 4 MG/3ML SOPN, Inject 1 mg into the skin once a week., Disp: 3 mL, Rfl: 2   Allergies  Allergen Reactions   Bee Venom Swelling     Review of Systems  Constitutional: Negative.  Negative for chills and fever.  HENT:  Negative for congestion, ear discharge and sinus pain.   Eyes: Negative.   Respiratory:  Negative for cough, shortness of breath and wheezing.   Cardiovascular:  Negative for chest pain and palpitations.  Gastrointestinal:  Negative for abdominal distention, constipation and diarrhea.  Musculoskeletal:  Negative for arthralgias and myalgias.  Skin: Negative.   Psychiatric/Behavioral: Negative.      Today's Vitals   08/26/21 0921  BP: 114/72  Pulse: 63  Temp: 98.3 F (36.8 C)  Weight: 246 lb 3.2 oz (111.7 kg)  Height: 5' 10.2" (1.783 m)  PainSc: 7   PainLoc: Knee   Body mass index is 35.13 kg/m.   Objective:  Physical Exam Constitutional:  Appearance: Normal appearance. He is obese.  HENT:     Head: Normocephalic and atraumatic.  Cardiovascular:     Rate and Rhythm: Normal rate and regular rhythm.     Pulses: Normal pulses.     Heart sounds: Normal heart sounds. No murmur heard. Pulmonary:     Effort: Pulmonary effort is normal. No respiratory distress.     Breath sounds: Normal breath sounds. No wheezing.  Skin:    General: Skin is warm and dry.     Capillary Refill: Capillary refill takes less than 2 seconds.  Neurological:     Mental Status: He is alert.        Assessment And Plan:     1. Class 2 obesity due to excess calories without serious comorbidity with body mass index  (BMI) of 35.0 to 35.9 in adult -He has been taking 0.5 ozempic. Tolerating well with no SE  -Will increase to 1 mg. Weekly. Prescription sent to pharmacy.  Advised patient on a healthy diet including avoiding fast food and red meats. Increase the intake of lean meats including grilled chicken and [place].  Drink a lot of water. Decrease intake of fatty foods. Exercise for 30-45 min. 4-5 a week to decrease the risk of cardiac event.   2. Influenza vaccination declined -Patient declined.   3. Prediabetes - Hemoglobin A1c - CMP14+EGFR - Semaglutide, 1 MG/DOSE, (OZEMPIC, 1 MG/DOSE,) 4 MG/3ML SOPN; Inject 1 mg into the skin once a week.  Dispense: 3 mL; Refill: 2  4. Vitamin D deficiency -Will check and supplement if needed. Advised patient to spend atleast 15 min. Daily in sunlight.  - Vitamin D (25 hydroxy)    The patient was encouraged to call or send a message through [place] for any questions or concerns.   Follow up: if symptoms persist or do not get better.   Staying healthy and adopting a healthy lifestyle for your overall health is important. You should eat 7 or more servings of fruits and vegetables per day. You should drink plenty of water to keep yourself hydrated and your kidneys healthy. This includes about 65-80+ fluid ounces of water. Limit your intake of animal fats especially for elevated cholesterol. Avoid highly processed food and limit your salt intake if you have hypertension. Avoid foods high in saturated/Trans fats. Along with a healthy diet it is also very important to maintain time for yourself to maintain a healthy mental health with low stress levels. You should get atleast 150 min of moderate intensity exercise weekly for a healthy heart. Along with eating right and exercising, aim for at least 7-9 hours of sleep daily.  Eat more whole grains which includes barley, wheat berries, oats, brown rice and whole wheat pasta. Use healthy plant oils which include olive, soy,  corn, sunflower and peanut. Limit your caffeine and sugary drinks. Limit your intake of fast foods. Limit milk and dairy products to one or two daily servings.   Side effects and appropriate use of all the medication(s) were discussed with the patient today. Patient advised to use the medication(s) as directed by their healthcare provider. The patient was encouraged to read, review, and understand all associated package inserts and contact our office with any questions or concerns. The patient accepts the risks of the treatment plan and had an opportunity to ask questions.   Patient was given opportunity to ask questions. Patient verbalized understanding of the plan and was able to repeat key elements of the plan. All questions were  answered to their satisfaction.  Raman Ghumman, DNP   I, Raman Ghumman have reviewed all documentation for this visit. The documentation on 08/26/21 for the exam, diagnosis, procedures, and orders are all accurate and complete.    IF YOU HAVE BEEN REFERRED TO A SPECIALIST, IT MAY TAKE 1-2 WEEKS TO SCHEDULE/PROCESS THE REFERRAL. IF YOU HAVE NOT HEARD FROM US/SPECIALIST IN TWO WEEKS, PLEASE GIVE US A CALL AT [phone].   THE PATIENT IS ENCOURAGED TO PRACTICE SOCIAL DISTANCING DUE TO THE COVID-19 PANDEMIC.    

## 2021-08-27 LAB — CMP14+EGFR
ALT: 35 IU/L (ref 0–44)
AST: 27 IU/L (ref 0–40)
Albumin/Globulin Ratio: 1.7 (ref 1.2–2.2)
Albumin: 4.7 g/dL (ref 4.0–5.0)
Alkaline Phosphatase: 60 IU/L (ref 44–121)
BUN/Creatinine Ratio: 10 (ref 9–20)
BUN: 11 mg/dL (ref 6–24)
Bilirubin Total: 0.4 mg/dL (ref 0.0–1.2)
CO2: 25 mmol/L (ref 20–29)
Calcium: 9.8 mg/dL (ref 8.7–10.2)
Chloride: 100 mmol/L (ref 96–106)
Creatinine, Ser: 1.07 mg/dL (ref 0.76–1.27)
Globulin, Total: 2.7 g/dL (ref 1.5–4.5)
Glucose: 92 mg/dL (ref 70–99)
Potassium: 4.4 mmol/L (ref 3.5–5.2)
Sodium: 139 mmol/L (ref 134–144)
Total Protein: 7.4 g/dL (ref 6.0–8.5)
eGFR: 90 mL/min/{1.73_m2} (ref >59)

## 2021-08-27 LAB — HEMOGLOBIN A1C
Est. average glucose Bld gHb Est-mCnc: 128 mg/dL
Hgb A1c MFr Bld: 6.1 % — ABNORMAL HIGH (ref 4.8–5.6)

## 2021-08-27 LAB — VITAMIN D 25 HYDROXY (VIT D DEFICIENCY, FRACTURES): Vit D, 25-Hydroxy: 39 ng/mL (ref 30.0–100.0)

## 2021-09-01 ENCOUNTER — Encounter: Payer: Self-pay | Admitting: Emergency Medicine

## 2021-09-01 ENCOUNTER — Other Ambulatory Visit: Payer: Self-pay

## 2021-09-01 ENCOUNTER — Ambulatory Visit
Admission: EM | Admit: 2021-09-01 | Discharge: 2021-09-01 | Disposition: A | Discharge status: Home / Self Care | Payer: BC Managed Care – PPO | Attending: Internal Medicine | Admitting: Internal Medicine

## 2021-09-01 DIAGNOSIS — J069 Acute upper respiratory infection, unspecified: Secondary | ICD-10-CM | POA: Insufficient documentation

## 2021-09-01 DIAGNOSIS — R509 Fever, unspecified: Secondary | ICD-10-CM | POA: Diagnosis present

## 2021-09-01 DIAGNOSIS — J029 Acute pharyngitis, unspecified: Secondary | ICD-10-CM | POA: Diagnosis present

## 2021-09-01 DIAGNOSIS — R6889 Other general symptoms and signs: Secondary | ICD-10-CM | POA: Diagnosis present

## 2021-09-01 DIAGNOSIS — Z20822 Contact with and (suspected) exposure to covid-19: Secondary | ICD-10-CM | POA: Diagnosis present

## 2021-09-01 LAB — POCT RAPID STREP A (OFFICE): Rapid Strep A Screen: NEGATIVE

## 2021-09-01 MED ORDER — PSEUDOEPHEDRINE HCL 30 MG PO TABS: 30.0000 mg | ORAL_TABLET | ORAL | 0 refills | Status: DC | PRN

## 2021-09-01 MED ORDER — FLUTICASONE PROPIONATE 50 MCG/ACT NA SUSP: 1.0000 | Freq: Every day | NASAL | 0 refills | Status: DC

## 2021-09-01 MED ORDER — CETIRIZINE HCL 10 MG PO TABS
10.0000 mg | ORAL_TABLET | Freq: Every day | ORAL | 0 refills | Status: DC
Start: 2021-09-01 — End: 2024-03-26

## 2021-09-01 MED ORDER — ACETAMINOPHEN 325 MG PO TABS
650.0000 mg | ORAL_TABLET | Freq: Once | ORAL | Status: AC
  Administered 2021-09-01: 650 mg via ORAL

## 2021-09-01 NOTE — Discharge Instructions (Addendum)
You likely having a viral upper respiratory infection. We recommended symptom control. I expect your symptoms to start improving in the next 1-2 weeks.   1. Take a daily allergy pill/anti-histamine like Zyrtec, Claritin, or Store brand consistently for 2 weeks.  You have been prescribed this medication.  Please be sure to not take cetirizine and levocetirizine together.  2. For congestion you may try an oral decongestant like Mucinex or sudafed. You may also try intranasal flonase nasal spray or saline irrigations (neti pot, sinus cleanse).  You have been prescribed this medication.  3. For your sore throat you may try cepacol lozenges, salt water gargles, throat spray. Treatment of congestion may also help your sore throat.  4. For cough you may try Robitussen  5. Take Tylenol or Ibuprofen to help with pain/inflammation  6. Stay hydrated, drink plenty of fluids to keep throat coated and less irritated  Honey Tea For cough/sore throat try using a honey-based tea. Use 3 teaspoons of honey with juice squeezed from half lemon. Place shaved pieces of ginger into 1/2-1 cup of water and warm over stove top. Then mix the ingredients and repeat every 4 hours as needed.   Please do not take cetirizine and levocetirizine together.  Rapid strep test was negative.  Throat culture and COVID-19, flu, RSV swabs are pending.  We will call if they are positive.

## 2021-09-01 NOTE — ED Provider Notes (Signed)
[facility]    CSN: [id number] Arrival date & time: 09/01/21  1351      History   Chief Complaint Chief Complaint  Patient presents with   Fever   Headache    HPI Brady Wells is a 40 y.o. male.   Patient presents with fever, chills, headache, nasal congestion, runny nose, cough, decreased appetite that has been present for approximately 4 to 5 days.  T-max at home was 101.  Patient has been taking over-the-counter cough and cold medications as well as Tylenol with minimal improvement in symptoms.  Daughter has similar symptoms and other daughter tested positive for RSV recently at pediatrician.  Has mild sensitivity to light but denies any neck pain or neck stiffness.  Denies nausea, vomiting, diarrhea, constipation, abdominal pain.   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 Authorizing Provider  cetirizine (ZYRTEC) 10 MG tablet Take 1 tablet (10 mg total) by mouth daily. 09/01/21  Yes Lance Muss, FNP  fluticasone (FLONASE) 50 MCG/ACT nasal spray Place 1 spray into both nostrils daily. 09/01/21  Yes Lance Muss, FNP  pseudoephedrine (SUDAFED) 30 MG tablet Take 1 tablet (30 mg total) by mouth every 4 (four) hours as needed for congestion. 09/01/21  Yes Lance Muss, FNP  albuterol (VENTOLIN HFA) 108 (90 Base) MCG/ACT inhaler INHALE 2 PUFF BY INHALATION ROUTE EVERY 4 - 6 HOURS  AS NEEDED AS NEEDED 05/20/21   Ghumman, Ramandeep, NP  EPIPEN 2-PAK 0.3 MG/0.3ML SOAJ injection Inject 0.3 mg into the muscle as needed. 05/20/21   Ghumman, Ramandeep, NP  fluticasone-salmeterol (ADVAIR DISKUS) 100-50 MCG/ACT AEPB Inhale 1 puff into the lungs 2 (two) times daily. 07/01/21   Ghumman, Ramandeep, NP  ibuprofen (ADVIL) 800 MG tablet Take 1 tablet (800 mg total) by mouth every 8 (eight) hours as needed. 02/20/21   Terrilee Files, MD  levocetirizine (XYZAL) 5 MG tablet Take 1 tablet (5 mg total) by mouth every evening. 05/20/21   Charlesetta Ivory, NP  Multiple Vitamin (MULTIVITAMIN WITH MINERALS) TABS Take 1 tablet by mouth daily.    [provider]  scopolamine (TRANSDERM-SCOP) 1 MG/3DAYS APPLY 1 TO HAIRLESS AREA BEHIND EAR EVERY 3 DAYS AT LEAST 4 HOURS BEFORE NEEDED EFFECT AS NEEDED 03/26/21   Ghumman, Ramandeep, NP  Semaglutide, 1 MG/DOSE, (OZEMPIC, 1 MG/DOSE,) 4 MG/3ML SOPN Inject 1 mg into the skin once a week. 08/26/21   Charlesetta Ivory, NP    Family History Family History  Problem Relation Age of Onset   Healthy Mother    Healthy Father     Social History Social History   Tobacco Use   Smoking status: Never   Smokeless tobacco: Never  Vaping Use   Vaping Use: Never used  Substance Use Topics   Alcohol use: Yes    Comment: socially   Drug use: Never  Allergies   Bee venom   Review of Systems Review of Systems Per HPI  Physical Exam Triage Vital Signs ED Triage Vitals  Enc Vitals Group     BP 09/01/21 1540 134/77     Pulse Rate 09/01/21 1540 85     Resp 09/01/21 1540 16     Temp 09/01/21 1540 100 F (37.8 C)     Temp Source 09/01/21 1540 Oral     SpO2 09/01/21 1540 96 %     Weight --      Height --      Head Circumference --      Peak Flow --      Pain Score 09/01/21 1543 8     Pain Loc --      Pain Edu? --      Excl. in GC? --    No data found.  Updated Vital Signs BP 134/77 (BP Location: Left Arm)   Pulse 85   Temp 100 F  (37.8 C) (Oral)   Resp 16   SpO2 96%   Visual Acuity Right Eye Distance:   Left Eye Distance:   Bilateral Distance:    Right Eye Near:   Left Eye Near:    Bilateral Near:     Physical Exam Constitutional:      General: He is not in acute distress.    Appearance: Normal appearance. He is not toxic-appearing or diaphoretic.  HENT:     Head: Normocephalic and atraumatic.     Right Ear: Ear canal normal. A middle ear effusion is present. Tympanic membrane is not erythematous or bulging.     Left Ear: Ear canal normal. A middle ear effusion is present. Tympanic membrane is not erythematous or bulging.     Nose: Congestion present.     Mouth/Throat:     Mouth: Mucous membranes are moist.     Pharynx: Posterior oropharyngeal erythema present.  Eyes:     Extraocular Movements: Extraocular movements intact.     Conjunctiva/sclera: Conjunctivae normal.     Pupils: Pupils are equal, round, and reactive to light.  Cardiovascular:     Rate and Rhythm: Normal rate and regular rhythm.     Pulses: Normal pulses.     Heart sounds: Normal heart sounds.  Pulmonary:     Effort: Pulmonary effort is normal. No respiratory distress.     Breath sounds: Normal breath sounds. No wheezing.  Abdominal:     General: Abdomen is flat. Bowel sounds are normal.     Palpations: Abdomen is soft.  Musculoskeletal:        General: Normal range of motion.     Cervical back: Normal range of motion.  Skin:    General: Skin is warm and dry.  Neurological:     General: No focal deficit present.     Mental Status: He is alert and oriented to person, place, and time. Mental status is at baseline.  Psychiatric:        Mood and Affect: Mood normal.        Behavior: Behavior normal.     UC Treatments / Results  Labs (all labs ordered are listed, but only abnormal results are displayed) Labs Reviewed  COVID-19, FLU A+B AND RSV  CULTURE, GROUP A STREP [facility])  POCT RAPID STREP A (OFFICE)     EKG   Radiology No results found.  Procedures Procedures (including critical care time)  Medications Ordered in UC Medications  acetaminophen (TYLENOL) tablet 650 mg (650  mg Oral Given 09/01/21 1556)    Initial Impression / Assessment and Plan / UC Course  I have reviewed the triage vital signs and the nursing notes.  Pertinent labs & imaging results that were available during my care of the patient were reviewed by me and considered in my medical decision making (see chart for details).     Patient presents with symptoms likely from a viral upper respiratory infection. Differential includes bacterial pneumonia, sinusitis, allergic rhinitis, Covid 19.  Highly suspicious for RSV given patient's close exposure.  Do not suspect underlying cardiopulmonary process. Symptoms seem unlikely related to ACS, CHF or COPD exacerbations, pneumonia, pneumothorax. Patient is nontoxic appearing and not in need of emergent medical intervention.  Recommended symptom control with over the counter medications: Daily oral anti-histamine, Oral decongestant or IN corticosteroid, saline irrigations, cepacol lozenges, Robitussin, Delsym, honey tea.  Patient was offered prescriptions for these medications.  Rapid strep test was negative.  Throat culture and COVID-19, flu, RSV tests are pending.  Fever monitoring and management discussed with patient.  Return if symptoms fail to improve in 1-2 weeks or you develop shortness of breath, chest pain, severe headache. Patient states understanding and is agreeable.  Discharged with PCP followup.  Final Clinical Impressions(s) / UC Diagnoses   Final diagnoses:  Viral upper respiratory tract infection with cough  Sore throat  Fever, unspecified  Encounter for laboratory testing for COVID-19 virus  Flu-like symptoms     Discharge Instructions      You likely having a viral upper respiratory infection. We recommended symptom control. I expect your symptoms  to start improving in the next 1-2 weeks.   1. Take a daily allergy pill/anti-histamine like Zyrtec, Claritin, or Store brand consistently for 2 weeks.  You have been prescribed this medication.  Please be sure to not take cetirizine and levocetirizine together.  2. For congestion you may try an oral decongestant like Mucinex or sudafed. You may also try intranasal flonase nasal spray or saline irrigations (neti pot, sinus cleanse).  You have been prescribed this medication.  3. For your sore throat you may try cepacol lozenges, salt water gargles, throat spray. Treatment of congestion may also help your sore throat.  4. For cough you may try Robitussen  5. Take Tylenol or Ibuprofen to help with pain/inflammation  6. Stay hydrated, drink plenty of fluids to keep throat coated and less irritated  Honey Tea For cough/sore throat try using a honey-based tea. Use 3 teaspoons of honey with juice squeezed from half lemon. Place shaved pieces of ginger into 1/2-1 cup of water and warm over stove top. Then mix the ingredients and repeat every 4 hours as needed.   Please do not take cetirizine and levocetirizine together.  Rapid strep test was negative.  Throat culture and COVID-19, flu, RSV swabs are pending.  We will call if they are positive.     ED Prescriptions     Medication Sig Dispense Auth. Provider   cetirizine (ZYRTEC) 10 MG tablet Take 1 tablet (10 mg total) by mouth daily. 30 tablet Lance Muss, FNP   fluticasone (FLONASE) 50 MCG/ACT nasal spray Place 1 spray into both nostrils daily. 16 g Lance Muss, FNP   pseudoephedrine (SUDAFED) 30 MG tablet Take 1 tablet (30 mg total) by mouth every 4 (four) hours as needed for congestion. 30 tablet Lance Muss, FNP      PDMP not reviewed this encounter.   Lance Muss, FNP 09/01/21 [phone]

## 2021-09-01 NOTE — ED Triage Notes (Addendum)
Fever, chills, headache, congestion, runny nose, coughing, lack of appetite since this passed Friday. Daughter has same symptoms. Mild sensitivity to light, denies neck pain and nausea

## 2021-09-02 LAB — COVID-19, FLU A+B AND RSV
Influenza A, NAA: NOT DETECTED
Influenza B, NAA: NOT DETECTED
RSV, NAA: DETECTED — AB
SARS-CoV-2, NAA: NOT DETECTED

## 2021-09-04 LAB — CULTURE, GROUP A STREP (THRC)

## 2021-09-06 ENCOUNTER — Telehealth: Payer: Self-pay | Admitting: Emergency Medicine

## 2021-09-06 NOTE — Telephone Encounter (Signed)
Spoke to pt he is improved no need for antibiotics at this time

## 2021-10-14 ENCOUNTER — Ambulatory Visit
Admission: EM | Admit: 2021-10-14 | Discharge: 2021-10-14 | Disposition: A | Discharge status: Home / Self Care | Payer: BC Managed Care – PPO | Attending: Physician Assistant | Admitting: Physician Assistant

## 2021-10-14 ENCOUNTER — Other Ambulatory Visit: Payer: Self-pay

## 2021-10-14 ENCOUNTER — Encounter: Payer: Self-pay | Admitting: Emergency Medicine

## 2021-10-14 DIAGNOSIS — J101 Influenza due to other identified influenza virus with other respiratory manifestations: Secondary | ICD-10-CM | POA: Diagnosis not present

## 2021-10-14 LAB — POCT INFLUENZA A/B
Influenza A, POC: POSITIVE — AB
Influenza B, POC: NEGATIVE

## 2021-10-14 MED ORDER — OSELTAMIVIR PHOSPHATE 75 MG PO CAPS: 75.0000 mg | ORAL_CAPSULE | Freq: Two times a day (BID) | ORAL | 0 refills | Status: DC

## 2021-10-14 NOTE — ED Triage Notes (Signed)
Patient c/o fever, body aches and chills, sneezing, no appetite since Sunday.  Patient has taken Tylenol and Ibuprofen, Nyquil Cold & Flu.  Patient is vaccinated for COVID.  Home COVID was negative.

## 2021-10-14 NOTE — ED Provider Notes (Signed)
[facility]    CSN: [id number] Arrival date & time: 10/14/21  1102      History   Chief Complaint Chief Complaint  Patient presents with   Fever    HPI Brady Wells is a 40 y.o. male.   Patient here today for evaluation of fever, body aches, cough and congestion that started about 3 days ago.  He has had some nausea but no vomiting or diarrhea.  He has tried multiple over-the-counter medications with mild relief.  The history is provided by the patient.  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 Authorizing Provider  albuterol (VENTOLIN HFA) 108 (90 Base) MCG/ACT inhaler INHALE 2 PUFF BY INHALATION ROUTE EVERY 4 - 6 HOURS AS NEEDED AS NEEDED 05/20/21  Yes Ghumman, Ramandeep, NP  cetirizine (ZYRTEC) 10 MG tablet Take 1 tablet (10 mg total) by mouth daily. 09/01/21  Yes Mound, Haley E, FNP  EPIPEN 2-PAK 0.3 MG/0.3ML SOAJ injection Inject 0.3 mg into the muscle as needed. 05/20/21  Yes Ghumman, Ramandeep, NP  fluticasone (FLONASE) 50 MCG/ACT nasal spray Place 1 spray into both nostrils daily. 09/01/21  Yes Mound, Rolly Salter E, FNP  fluticasone-salmeterol (ADVAIR DISKUS) 100-50 MCG/ACT AEPB Inhale 1 puff into the lungs 2 (two) times daily. 07/01/21  Yes Ghumman, Ramandeep, NP   ibuprofen (ADVIL) 800 MG tablet Take 1 tablet (800 mg total) by mouth every 8 (eight) hours as needed. 02/20/21  Yes Terrilee Files, MD  levocetirizine (XYZAL) 5 MG tablet Take 1 tablet (5 mg total) by mouth every evening. 05/20/21  Yes Ghumman, Ramandeep, NP  Multiple Vitamin (MULTIVITAMIN WITH MINERALS) TABS Take 1 tablet by mouth daily.   Yes [provider]  oseltamivir (TAMIFLU) 75 MG capsule Take 1 capsule (75 mg total) by mouth every 12 (twelve) hours. 10/14/21  Yes Tomi Bamberger, PA-C  pseudoephedrine (SUDAFED) 30 MG tablet Take 1 tablet (30 mg total) by mouth every 4 (four) hours as needed for congestion. 09/01/21  Yes Mound, Rolly Salter E, FNP  scopolamine (TRANSDERM-SCOP) 1 MG/3DAYS APPLY 1 TO HAIRLESS AREA BEHIND EAR EVERY 3 DAYS AT LEAST 4 HOURS BEFORE NEEDED EFFECT AS NEEDED 03/26/21  Yes Ghumman, Ramandeep, NP  Semaglutide, 1 MG/DOSE, (OZEMPIC, 1 MG/DOSE,) 4 MG/3ML SOPN Inject 1 mg into the skin once a week. 08/26/21  Yes Charlesetta Ivory, NP    Family History Family History  Problem Relation Age of Onset   Healthy Mother    Healthy Father     Social History Social History   Tobacco Use   Smoking status: Never   Smokeless tobacco: Never  Vaping Use   Vaping Use: Never used  Substance Use Topics   Alcohol use: Yes    Comment: socially   Drug  use: Never     Allergies   Bee venom   Review of Systems Review of Systems  Constitutional:  Positive for chills and fever.  HENT:  Positive for congestion and sore throat. Negative for ear pain.   Eyes:  Negative for discharge and redness.  Respiratory:  Positive for cough. Negative for shortness of breath.   Gastrointestinal:  Positive for nausea. Negative for abdominal pain and vomiting.    Physical Exam Triage Vital Signs ED Triage Vitals  Enc Vitals Group     BP 10/14/21 1221 129/85     Pulse Rate 10/14/21 1221 95     Resp --      Temp 10/14/21 1221 100.1 F (37.8 C)     Temp Source 10/14/21 1221  Oral     SpO2 10/14/21 1221 97 %     Weight 10/14/21 1222 232 lb (105.2 kg)     Height 10/14/21 1222 6\' 1"  (1.854 m)     Head Circumference --      Peak Flow --      Pain Score 10/14/21 1222 10     Pain Loc --      Pain Edu? --      Excl. in GC? --    No data found.  Updated Vital Signs BP 129/85 (BP Location: Right Arm)   Pulse 95   Temp 100.1 F (37.8 C) (Oral)   Ht 6\' 1"  (1.854 m)   Wt 232 lb (105.2 kg)   SpO2 97%   BMI 30.61 kg/m      Physical Exam Vitals and nursing note reviewed.  Constitutional:      General: He is not in acute distress.    Appearance: Normal appearance. He is not ill-appearing.  HENT:     Head: Normocephalic and atraumatic.     Nose: Congestion present.     Mouth/Throat:     Mouth: Mucous membranes are moist.     Pharynx: Oropharynx is clear. No oropharyngeal exudate or posterior oropharyngeal erythema.  Eyes:     Conjunctiva/sclera: Conjunctivae normal.  Cardiovascular:     Rate and Rhythm: Normal rate and regular rhythm.     Heart sounds: Normal heart sounds. No murmur heard. Pulmonary:     Effort: Pulmonary effort is normal. No respiratory distress.     Breath sounds: Normal breath sounds. No wheezing, rhonchi or rales.  Skin:    General: Skin is warm and dry.  Neurological:     Mental Status: He is alert.  Psychiatric:        Mood and Affect: Mood normal.        Thought Content: Thought content normal.     UC Treatments / Results  Labs (all labs ordered are listed, but only abnormal results are displayed) Labs Reviewed  POCT INFLUENZA A/B - Abnormal; Notable for the following components:      Result Value   Influenza A, POC Positive (*)    All other components within normal limits    EKG   Radiology No results found.  Procedures Procedures (including critical care time)  Medications Ordered in UC Medications - No data to display  Initial Impression / Assessment and Plan / UC Course  I have reviewed the triage  vital signs and the nursing notes.  Pertinent labs & imaging results that were available during my care of the patient were reviewed by me and considered in my medical decision making (see chart for details).  Flu test positive in office.  Tamiflu prescribed and encouraged increase fluids and rest.  Encouraged follow-up with any further concerns.  Final Clinical Impressions(s) / UC Diagnoses   Final diagnoses:  Influenza A   Discharge Instructions   None    ED Prescriptions     Medication Sig Dispense Auth. Provider   oseltamivir (TAMIFLU) 75 MG capsule Take 1 capsule (75 mg total) by mouth every 12 (twelve) hours. 10 capsule Tomi Bamberger, PA-C      PDMP not reviewed this encounter.   Tomi Bamberger, PA-C 10/14/21 1300

## 2021-10-28 ENCOUNTER — Other Ambulatory Visit: Payer: Self-pay

## 2021-10-28 ENCOUNTER — Ambulatory Visit (INDEPENDENT_AMBULATORY_CARE_PROVIDER_SITE_OTHER): Payer: BC Managed Care – PPO | Admitting: Nurse Practitioner

## 2021-10-28 ENCOUNTER — Encounter: Payer: Self-pay | Admitting: Nurse Practitioner

## 2021-10-28 VITALS — BP 122/80 | HR 67 | Temp 97.8°F | Ht 73.0 in | Wt 234.4 lb

## 2021-10-28 DIAGNOSIS — Z6830 Body mass index (BMI) 30.0-30.9, adult: Secondary | ICD-10-CM | POA: Diagnosis not present

## 2021-10-28 DIAGNOSIS — Z2821 Immunization not carried out because of patient refusal: Secondary | ICD-10-CM

## 2021-10-28 DIAGNOSIS — R7303 Prediabetes: Secondary | ICD-10-CM

## 2021-10-28 DIAGNOSIS — E6609 Other obesity due to excess calories: Secondary | ICD-10-CM

## 2021-10-28 LAB — HEMOGLOBIN A1C
Est. average glucose Bld gHb Est-mCnc: 131 mg/dL
Hgb A1c MFr Bld: 6.2 % — ABNORMAL HIGH (ref 4.8–5.6)

## 2021-10-28 NOTE — Patient Instructions (Signed)

## 2021-10-28 NOTE — Progress Notes (Signed)
I,Brady Wells,acting as a Neurosurgeon for [employer], NP.,have documented all relevant documentation on the behalf of [employer], NP,as directed by  Charlesetta Ivory, NP while in the presence of Charlesetta Ivory, NP.  This visit occurred during the SARS-CoV-2 public health emergency.  Safety protocols were in place, including screening questions prior to the visit, additional usage of staff PPE, and extensive cleaning of exam room while observing appropriate contact time as indicated for disinfecting solutions.  Subjective:     Patient ID: Brady Wells , male    DOB: 1981/03/18 , 40 y.o.   MRN: [id number]   Chief Complaint  Patient presents with   Weight Check    HPI  Patient presents today for a weight check Wt Readings from Last 3 Encounters: 10/28/21 : 234 lb 6.4 oz (106.3 kg) 10/14/21 : 232 lb (105.2 kg) 08/26/21 : 246 lb 3.2 oz (111.7 kg) Diet: He is watching out with what he is eaing and he has been exercising.       Past Medical History:  Diagnosis Date   Asthma    Depression      Family History  Problem Relation Age of Onset   Healthy Mother    Healthy Father      Current Outpatient Medications:    albuterol (VENTOLIN HFA) 108 (90 Base) MCG/ACT inhaler, INHALE 2 PUFF BY INHALATION ROUTE EVERY 4 - 6 HOURS AS NEEDED AS NEEDED, Disp: 18 g, Rfl: 3   cetirizine (ZYRTEC) 10 MG tablet, Take 1 tablet (10 mg total) by mouth daily., Disp: 30 tablet, Rfl: 0   EPIPEN 2-PAK 0.3 MG/0.3ML SOAJ injection, Inject 0.3 mg into the muscle as needed., Disp: 2 each, Rfl: 3   fluticasone (FLONASE) 50 MCG/ACT nasal spray, Place 1 spray into both nostrils daily., Disp: 16 g, Rfl: 0   fluticasone-salmeterol (ADVAIR DISKUS) 100-50 MCG/ACT AEPB, Inhale 1 puff into the lungs 2 (two) times daily., Disp: 120 each, Rfl: 2   ibuprofen (ADVIL) 800 MG tablet, Take 1 tablet (800 mg total) by mouth every 8 (eight) hours as needed., Disp: 30 tablet, Rfl: 0   levocetirizine  (XYZAL) 5 MG tablet, Take 1 tablet (5 mg total) by mouth every evening., Disp: 90 tablet, Rfl: 1   Multiple Vitamin (MULTIVITAMIN WITH MINERALS) TABS, Take 1 tablet by mouth daily., Disp: , Rfl:    scopolamine (TRANSDERM-SCOP) 1 MG/3DAYS, APPLY 1 TO HAIRLESS AREA BEHIND EAR EVERY 3 DAYS AT LEAST 4 HOURS BEFORE NEEDED EFFECT AS NEEDED, Disp: 24 patch, Rfl: 1   Semaglutide, 1 MG/DOSE, (OZEMPIC, 1 MG/DOSE,) 4 MG/3ML SOPN, Inject 1 mg into the skin once a week., Disp: 3 mL, Rfl: 2   Allergies  Allergen Reactions   Bee Venom Swelling     Review of Systems  Constitutional:  Negative for chills and fever.  HENT:  Negative for congestion.   Respiratory:  Negative for cough, shortness of breath and wheezing.   Cardiovascular:  Negative for chest pain and palpitations.  Gastrointestinal:  Negative for abdominal distention, constipation and diarrhea.  Endocrine: Negative for polydipsia, polyphagia and polyuria.  Musculoskeletal:  Negative for arthralgias and myalgias.  Neurological:  Negative for weakness, numbness and headaches.    Today's Vitals   10/28/21 1403  BP: 122/80  Pulse: 67  Temp: 97.8 F (36.6 C)  Weight: 234 lb 6.4 oz (106.3 kg)  Height: 6\' 1"  (1.854 m)  PainSc: 0-No pain   Body mass index is 30.93 kg/m.  Wt Readings from Last 3  Encounters:  10/28/21 234 lb 6.4 oz (106.3 kg)  10/14/21 232 lb (105.2 kg)  08/26/21 246 lb 3.2 oz (111.7 kg)     Objective:  Physical Exam Constitutional:      Appearance: Normal appearance. He is obese.  HENT:     Head: Normocephalic and atraumatic.  Cardiovascular:     Rate and Rhythm: Normal rate and regular rhythm.     Pulses: Normal pulses.     Heart sounds: Normal heart sounds. No murmur heard. Skin:    General: Skin is warm and dry.  Neurological:     Mental Status: He is alert.        Assessment And Plan:     1. Class 1 obesity due to excess calories with body mass index (BMI) of 30.0 to 30.9 in adult, unspecified whether  serious comorbidity present -Advised patient on a healthy diet including avoiding fast food and red meats. Increase the intake of lean meats including grilled chicken and [name].  Drink a lot of water. Decrease intake of fatty foods. Exercise for 30-45 min. 4-5 a week to decrease the risk of cardiac event.   2. Prediabetes - Hemoglobin A1c -Continue to use ozempic 1 mg   3. Influenza vaccination declined  -Risks and benefits of the flu provided to the patient.   The patient was encouraged to call or send a message through MyChart for any questions or concerns.   Follow up: if symptoms persist or do not get better.   Side effects and appropriate use of all the medication(s) were discussed with the patient today. Patient advised to use the medication(s) as directed by their healthcare provider. The patient was encouraged to read, review, and understand all associated package inserts and contact our office with any questions or concerns. The patient accepts the risks of the treatment plan and had an opportunity to ask questions.   Patient was given opportunity to ask questions. Patient verbalized understanding of the plan and was able to repeat key elements of the plan. All questions were answered to their satisfaction.  Raman Claira Jeter, DNP   I, Raman Amayah Staheli have reviewed all documentation for this visit. The documentation on 04/09/21 for the exam, diagnosis, procedures, and orders are all accurate and complete.    IF YOU HAVE BEEN REFERRED TO A SPECIALIST, IT MAY TAKE 1-2 WEEKS TO SCHEDULE/PROCESS THE REFERRAL. IF YOU HAVE NOT HEARD FROM US/SPECIALIST IN TWO WEEKS, PLEASE GIVE [place] A CALL AT [phone].   THE PATIENT IS ENCOURAGED TO PRACTICE SOCIAL DISTANCING DUE TO THE COVID-19 PANDEMIC.

## 2021-11-05 ENCOUNTER — Ambulatory Visit
Admission: EM | Admit: 2021-11-05 | Discharge: 2021-11-05 | Disposition: A | Discharge status: Home / Self Care | Payer: BC Managed Care – PPO | Attending: Internal Medicine | Admitting: Internal Medicine

## 2021-11-05 ENCOUNTER — Other Ambulatory Visit: Payer: Self-pay

## 2021-11-05 DIAGNOSIS — S0502XA Injury of conjunctiva and corneal abrasion without foreign body, left eye, initial encounter: Secondary | ICD-10-CM

## 2021-11-05 MED ORDER — OFLOXACIN 0.3 % OP SOLN: 1.0000 [drp] | Freq: Four times a day (QID) | OPHTHALMIC | 0 refills | Status: AC

## 2021-11-05 NOTE — Discharge Instructions (Signed)
Please go to the provided address for eye doctor tomorrow at 8 AM for further evaluation.  You have been prescribed an antibiotic eyedrop to prevent and/or treat infection.  It appears that you have a corneal abrasion.

## 2021-11-05 NOTE — ED Triage Notes (Signed)
Pt c/o watering discharge from right eye a few days ago. Noticed that yesterday it is now redness.   Redness and discharge only from right eye but both eyes are sensitive to light.

## 2021-11-05 NOTE — ED Provider Notes (Signed)
[facility]    CSN: [id number] Arrival date & time: 11/05/21  1145      History   Chief Complaint No chief complaint on file.   HPI Brady Wells is a 40 y.o. male.   Patient presents with left eye irritation and pain that started a few days ago.  Patient denies any obvious trauma or foreign bodies to the eye.  Patient does report that he possibly may have gotten something in his eye yesterday but is not sure.  Has watery drainage from the eye.  Denies any obvious blurry vision.  Denies any upper respiratory symptoms or fever.    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 Authorizing Provider  ofloxacin (OCUFLOX) 0.3 % ophthalmic solution Place 1 drop into the right eye 4 (four) times daily for 10 days. 11/05/21 11/15/21 Yes Caprisha Bridgett, Acie Fredrickson, FNP  albuterol (VENTOLIN HFA) 108 (90 Base) MCG/ACT inhaler INHALE 2 PUFF BY INHALATION ROUTE EVERY 4 - 6 HOURS AS NEEDED AS NEEDED 05/20/21   Charlesetta Ivory, NP  cetirizine (ZYRTEC) 10 MG tablet Take 1 tablet (10 mg total) by mouth daily. 09/01/21   Jaynia Fendley, Acie Fredrickson, FNP  EPIPEN 2-PAK 0.3 MG/0.3ML SOAJ injection Inject 0.3 mg into the muscle as needed. 05/20/21   Ghumman, Ramandeep, NP  fluticasone (FLONASE) 50 MCG/ACT nasal spray Place 1 spray into both nostrils daily. 09/01/21   Gustavus Bryant, FNP  fluticasone-salmeterol (ADVAIR DISKUS) 100-50 MCG/ACT AEPB Inhale 1  puff into the lungs 2 (two) times daily. 07/01/21   Ghumman, Ramandeep, NP  ibuprofen (ADVIL) 800 MG tablet Take 1 tablet (800 mg total) by mouth every 8 (eight) hours as needed. 02/20/21   Terrilee Files, MD  levocetirizine (XYZAL) 5 MG tablet Take 1 tablet (5 mg total) by mouth every evening. 05/20/21   Charlesetta Ivory, NP  Multiple Vitamin (MULTIVITAMIN WITH MINERALS) TABS Take 1 tablet by mouth daily.    [provider]  scopolamine (TRANSDERM-SCOP) 1 MG/3DAYS APPLY 1 TO HAIRLESS AREA BEHIND EAR EVERY 3 DAYS AT LEAST 4 HOURS BEFORE NEEDED EFFECT AS NEEDED 03/26/21   Ghumman, Ramandeep, NP  Semaglutide, 1 MG/DOSE, (OZEMPIC, 1 MG/DOSE,) 4 MG/3ML SOPN Inject 1 mg into the skin once a week. 08/26/21   Charlesetta Ivory, NP    Family History Family History  Problem Relation Age of Onset   Healthy Mother    Healthy Father     Social History Social History   Tobacco Use   Smoking status: Never   Smokeless tobacco: Never  Vaping Use   Vaping Use: Never used  Substance Use Topics   Alcohol use: Yes    Comment: socially   Drug use: Never     Allergies   Bee venom   Review of Systems Review of Systems Per HPI  Physical Exam Triage Vital Signs ED Triage Vitals [11/05/21 1405]  Enc Vitals Group     BP 111/78     Pulse Rate 71     Resp 18     Temp 97.9 F (36.6 C)     Temp Source Oral     SpO2 98 %     Weight      Height      Head Circumference      Peak Flow      Pain Score 0     Pain Loc      Pain Edu?      Excl. in GC?    No data found.  Updated Vital Signs BP 111/78 (BP Location: Left Arm)   Pulse 71   Temp 97.9 F (36.6 C) (Oral)   Resp 18   SpO2 98%   Visual Acuity Right Eye Distance: 20/20 Left Eye Distance: 20/20 Bilateral Distance: 20/13  Right Eye Near:   Left Eye Near:    Bilateral Near:     Physical Exam Constitutional:      General: He is not in acute distress.    Appearance: Normal appearance. He is not toxic-appearing or  diaphoretic.  HENT:     Head: Normocephalic and atraumatic.  Eyes:     General: Lids are normal. Lids are everted, no foreign bodies appreciated. Vision grossly intact. Gaze aligned appropriately.        Right eye: No foreign body, discharge or hordeolum.        Left eye: No foreign body, discharge or hordeolum.     Extraocular Movements: Extraocular movements intact.     Conjunctiva/sclera: Conjunctivae normal.     Pupils: Pupils are equal, round, and reactive to light.     Left eye: Corneal abrasion and fluorescein uptake present.     Comments: Approximately 5 mm corneal abrasion noted to left lower eye above left lower iris.  Scleral redness present.   Pulmonary:     Effort: Pulmonary effort is normal.  Neurological:     General: No focal deficit present.     Mental Status: He is alert and oriented to person, place, and time. Mental status is at baseline.  Psychiatric:        Mood and Affect: Mood normal.        Behavior: Behavior normal.        Thought Content: Thought content normal.        Judgment: Judgment normal.     UC Treatments / Results  Labs (all labs ordered are listed, but only abnormal results are displayed) Labs Reviewed - No data to display  EKG   Radiology No results found.  Procedures Procedures (including critical care time)  Medications Ordered in UC Medications - No data to display  Initial Impression / Assessment and Plan / UC Course  I have reviewed the triage vital signs and the nursing notes.  Pertinent labs & imaging results that were available during my care of the patient were reviewed by me and considered in my medical decision making (see chart for details).     Upon original examination to, there was no obvious abnormality other than scleral redness.  Fluorescein stain performed with possible string-like foreign body noted to right eye on original exam with no evidence of corneal abrasion.  Patient's eye was washed out with removal of  this possible foreign body.  On second exam of eye with black light, there was a possible corneal abrasion noted to the left lower eye.  This was strange as there was  no original corneal abrasion.  Dr. Dione Booze with on-call ophthalmology was called due to this strange finding.  He agreed that this seems consistent with corneal abrasion, but he would like to evaluate patient tomorrow morning at 8 AM in his office for to ensure that there is no other worrisome etiology.  He recommended to prescribe ofloxacin 4 times daily for 10 days and to follow-up tomorrow morning at his office.  Discussed this with patient.  Patient voiced understanding and verbalized agreement. Provided patient with address.  Final Clinical Impressions(s) / UC Diagnoses   Final diagnoses:  Abrasion of left cornea, initial encounter     Discharge Instructions      Please go to the provided address for eye doctor tomorrow at 8 AM for further evaluation.  You have been prescribed an antibiotic eyedrop to prevent and/or treat infection.  It appears that you have a corneal abrasion.    ED Prescriptions     Medication Sig Dispense Auth. Provider   ofloxacin (OCUFLOX) 0.3 % ophthalmic solution Place 1 drop into the right eye 4 (four) times daily for 10 days. 5 mL Gustavus Bryant, [place]      PDMP not reviewed this encounter.   Gustavus Bryant, [place] 11/05/21 1556

## 2021-11-10 ENCOUNTER — Ambulatory Visit
Admission: RE | Admit: 2021-11-10 | Discharge: 2021-11-10 | Disposition: A | Discharge status: Home / Self Care | Payer: BC Managed Care – PPO | Source: Ambulatory Visit | Attending: Internal Medicine | Admitting: Internal Medicine

## 2021-11-10 ENCOUNTER — Other Ambulatory Visit: Payer: Self-pay

## 2021-11-10 ENCOUNTER — Ambulatory Visit (INDEPENDENT_AMBULATORY_CARE_PROVIDER_SITE_OTHER): Payer: BC Managed Care – PPO

## 2021-11-10 VITALS — BP 117/79 | HR 69 | Temp 98.5°F | Resp 18

## 2021-11-10 DIAGNOSIS — R6883 Chills (without fever): Secondary | ICD-10-CM | POA: Diagnosis not present

## 2021-11-10 DIAGNOSIS — J069 Acute upper respiratory infection, unspecified: Secondary | ICD-10-CM | POA: Diagnosis present

## 2021-11-10 DIAGNOSIS — J029 Acute pharyngitis, unspecified: Secondary | ICD-10-CM

## 2021-11-10 DIAGNOSIS — R059 Cough, unspecified: Secondary | ICD-10-CM | POA: Diagnosis not present

## 2021-11-10 LAB — POCT RAPID STREP A (OFFICE): Rapid Strep A Screen: NEGATIVE

## 2021-11-10 MED ORDER — BENZONATATE 100 MG PO CAPS: 100.0000 mg | ORAL_CAPSULE | Freq: Three times a day (TID) | ORAL | 0 refills | Status: DC | PRN

## 2021-11-10 NOTE — ED Provider Notes (Signed)
[facility]    CSN: [id number] Arrival date & time: 11/10/21  1547      History   Chief Complaint Chief Complaint  Patient presents with   Cough    HPI Brady Wells is a 40 y.o. male.   Patient presents with a 2-day history of chills, decreased appetite, sore throat, generalized body aches, intermittent headache, productive cough.  Patient reports that cough is productive with pink-tinged sputum.  Mother has had similar symptoms recently.  Denies any known fevers.  Denies chest pain, shortness of breath, nausea, vomiting, diarrhea, abdominal pain.  Patient has not taken any medications to help alleviate symptoms.   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 Authorizing Provider  benzonatate (TESSALON) 100 MG capsule Take 1 capsule (100 mg total) by mouth every 8 (eight) hours as needed for cough. 11/10/21  Yes Skyelar Swigart, Rolly Salter E, FNP  albuterol (VENTOLIN HFA) 108 (90 Base) MCG/ACT inhaler INHALE 2 PUFF BY INHALATION ROUTE EVERY 4 - 6 HOURS AS NEEDED AS NEEDED 05/20/21   Charlesetta Ivory, NP  cetirizine (ZYRTEC) 10 MG tablet Take 1 tablet (10 mg total) by mouth daily. 09/01/21   Hartwell Vandiver, Acie Fredrickson, FNP  EPIPEN 2-PAK 0.3 MG/0.3ML SOAJ injection Inject 0.3 mg into the muscle as needed. 05/20/21   Ghumman, Ramandeep, NP  fluticasone (FLONASE) 50 MCG/ACT nasal spray Place 1 spray into both nostrils daily.  09/01/21   Gustavus Bryant, FNP  fluticasone-salmeterol (ADVAIR DISKUS) 100-50 MCG/ACT AEPB Inhale 1 puff into the lungs 2 (two) times daily. 07/01/21   Ghumman, Ramandeep, NP  ibuprofen (ADVIL) 800 MG tablet Take 1 tablet (800 mg total) by mouth every 8 (eight) hours as needed. 02/20/21   Terrilee Files, MD  levocetirizine (XYZAL) 5 MG tablet Take 1 tablet (5 mg total) by mouth every evening. 05/20/21   Charlesetta Ivory, NP  Multiple Vitamin (MULTIVITAMIN WITH MINERALS) TABS Take 1 tablet by mouth daily.    [provider]  ofloxacin (OCUFLOX) 0.3 % ophthalmic solution Place 1 drop into the right eye 4 (four) times daily for 10 days. 11/05/21 11/15/21  Gustavus Bryant, FNP  scopolamine (TRANSDERM-SCOP) 1 MG/3DAYS APPLY 1 TO HAIRLESS AREA BEHIND EAR EVERY 3 DAYS AT LEAST 4 HOURS BEFORE NEEDED EFFECT AS NEEDED 03/26/21   Ghumman, Ramandeep, NP  Semaglutide, 1 MG/DOSE, (OZEMPIC, 1 MG/DOSE,) 4 MG/3ML SOPN Inject 1 mg into the skin once a week. 08/26/21   Charlesetta Ivory, NP    Family History Family History  Problem Relation Age of Onset   Healthy Mother    Healthy Father     Social History Social History   Tobacco Use   Smoking status: Never   Smokeless tobacco: Never  Vaping Use   Vaping Use: Never used  Substance Use Topics   Alcohol use: Yes    Comment: socially   Drug  use: Never     Allergies   Bee venom   Review of Systems Review of Systems Per HPI  Physical Exam Triage Vital Signs ED Triage Vitals  Enc Vitals Group     BP 11/10/21 1610 117/79     Pulse Rate 11/10/21 1610 69     Resp 11/10/21 1610 18     Temp 11/10/21 1610 98.5 F (36.9 C)     Temp Source 11/10/21 1610 Oral     SpO2 11/10/21 1610 97 %     Weight --      Height --      Head Circumference --      Peak Flow --      Pain Score 11/10/21 1613 8     Pain Loc --      Pain Edu? --      Excl. in [place]? --    No data found.  Updated Vital Signs BP 117/79 (BP Location: Left Arm)    Pulse 69     Temp 98.5 F (36.9 C) (Oral)    Resp 18    SpO2 97%   Visual Acuity Right Eye Distance:   Left Eye Distance:   Bilateral Distance:    Right Eye Near:   Left Eye Near:    Bilateral Near:     Physical Exam Constitutional:      General: He is not in acute distress.    Appearance: Normal appearance. He is not toxic-appearing or diaphoretic.  HENT:     Head: Normocephalic and atraumatic.     Right Ear: Tympanic membrane and ear canal normal.     Left Ear: Tympanic membrane and ear canal normal.     Nose: Congestion present.     Mouth/Throat:     Mouth: Mucous membranes are moist.     Pharynx: Posterior oropharyngeal erythema present.  Eyes:     Extraocular Movements: Extraocular movements intact.     Conjunctiva/sclera: Conjunctivae normal.     Pupils: Pupils are equal, round, and reactive to light.  Cardiovascular:     Rate and Rhythm: Normal rate and regular rhythm.     Pulses: Normal pulses.     Heart sounds: Normal heart sounds.  Pulmonary:     Effort: Pulmonary effort is normal. No respiratory distress.     Breath sounds: Normal breath sounds. No stridor. No wheezing, rhonchi or rales.  Abdominal:     General: Abdomen is flat. Bowel sounds are normal.     Palpations: Abdomen is soft.  Musculoskeletal:        General: Normal range of motion.     Cervical back: Normal range of motion.  Skin:    General: Skin is warm and dry.  Neurological:     General: No focal deficit present.     Mental Status: He is alert and oriented to person, place, and time. Mental status is at baseline.  Psychiatric:        Mood and Affect: Mood normal.        Behavior: Behavior normal.     UC Treatments / Results  Labs (all labs ordered are listed, but only abnormal results are displayed) Labs Reviewed  CULTURE, GROUP A STREP ([facility])  COVID-19, FLU A+B NAA  POCT RAPID STREP A (OFFICE)    EKG   Radiology DG Chest 2 View  Result Date: 11/10/2021 CLINICAL DATA:  Productive cough  and chills for 2 days. Decreased appetite. EXAM: CHEST - 2 VIEW COMPARISON:  02/20/2021 FINDINGS:  The heart size and mediastinal contours are within normal limits. Both lungs are clear. The visualized skeletal structures are unremarkable. IMPRESSION: Normal exam. Electronically Signed   By: Danae Orleans M.D.   On: 11/10/2021 16:49    Procedures Procedures (including critical care time)  Medications Ordered in UC Medications - No data to display  Initial Impression / Assessment and Plan / UC Course  I have reviewed the triage vital signs and the nursing notes.  Pertinent labs & imaging results that were available during my care of the patient were reviewed by me and considered in my medical decision making (see chart for details).     Patient presents with symptoms likely from a viral upper respiratory infection. Differential includes bacterial pneumonia, sinusitis, allergic rhinitis, COVID-19, flu. Do not suspect underlying cardiopulmonary process. Symptoms seem unlikely related to ACS, CHF or COPD exacerbations, pneumonia, pneumothorax. Patient is nontoxic appearing and not in need of emergent medical intervention.  Rapid strep is negative.  Throat culture, COVID-19, flu test pending.   Chest x-ray normal.   Recommended symptom control with over the counter medications: Daily oral anti-histamine, Oral decongestant or IN corticosteroid, saline irrigations, cepacol lozenges, Robitussin, Delsym, honey tea.  Patient offered prescriptions.  Return if symptoms fail to improve in 1-2 weeks or you develop shortness of breath, chest pain, severe headache. Patient states understanding and is agreeable.  Discharged with PCP followup.  Final Clinical Impressions(s) / UC Diagnoses   Final diagnoses:  Viral upper respiratory tract infection with cough  Sore throat     Discharge Instructions      Your x-ray was normal.  Your rapid strep was negative.  Throat culture, COVID-19, flu swab  pending.  We will call you if it is positive.  It appears that you have a viral upper respiratory infection that should resolve in the next few days with symptomatic treatment.  You have been prescribed a cough medication.     ED Prescriptions     Medication Sig Dispense Auth. Provider   benzonatate (TESSALON) 100 MG capsule Take 1 capsule (100 mg total) by mouth every 8 (eight) hours as needed for cough. 21 capsule Oliver, Acie Fredrickson, [place]      PDMP not reviewed this encounter.   Gustavus Bryant, [place] 11/10/21 [phone]

## 2021-11-10 NOTE — Discharge Instructions (Signed)
Your x-ray was normal.  Your rapid strep was negative.  Throat culture, COVID-19, flu swab pending.  We will call you if it is positive.  It appears that you have a viral upper respiratory infection that should resolve in the next few days with symptomatic treatment.  You have been prescribed a cough medication.

## 2021-11-10 NOTE — ED Triage Notes (Signed)
Two day h/o chills, decreased appetite, sore throat, neck and shoulder pain, HA and onset yesterday of productive cough. Confirms dysphagia more on the left side. No meds taken.  Mom with similar sxs.

## 2021-11-11 LAB — COVID-19, FLU A+B NAA
Influenza A, NAA: NOT DETECTED
Influenza B, NAA: NOT DETECTED
SARS-CoV-2, NAA: NOT DETECTED

## 2021-11-13 LAB — CULTURE, GROUP A STREP (THRC)

## 2021-11-27 ENCOUNTER — Other Ambulatory Visit: Payer: Self-pay | Admitting: Nurse Practitioner

## 2021-11-27 DIAGNOSIS — R7303 Prediabetes: Secondary | ICD-10-CM

## 2021-12-17 ENCOUNTER — Other Ambulatory Visit: Payer: Self-pay | Admitting: Nurse Practitioner

## 2021-12-28 ENCOUNTER — Ambulatory Visit: Payer: BC Managed Care – PPO | Admitting: Internal Medicine

## 2021-12-31 ENCOUNTER — Ambulatory Visit
Admission: RE | Admit: 2021-12-31 | Discharge: 2021-12-31 | Disposition: A | Discharge status: Home / Self Care | Payer: BC Managed Care – PPO | Source: Ambulatory Visit | Attending: Internal Medicine | Admitting: Internal Medicine

## 2021-12-31 ENCOUNTER — Other Ambulatory Visit: Payer: Self-pay

## 2021-12-31 VITALS — BP 119/85 | HR 78 | Temp 98.3°F | Resp 18 | Ht 73.0 in | Wt 234.3 lb

## 2021-12-31 DIAGNOSIS — J069 Acute upper respiratory infection, unspecified: Secondary | ICD-10-CM

## 2021-12-31 MED ORDER — FLUTICASONE PROPIONATE 50 MCG/ACT NA SUSP
1.0000 | Freq: Every day | NASAL | 0 refills | Status: AC
Start: 2021-12-31 — End: 2022-06-28

## 2021-12-31 MED ORDER — BENZONATATE 100 MG PO CAPS: 100.0000 mg | ORAL_CAPSULE | Freq: Three times a day (TID) | ORAL | 0 refills | Status: DC | PRN

## 2021-12-31 NOTE — ED Triage Notes (Signed)
Patient c/o chest congestion, cough just got off a cruise.  Patient is afebrile.  Patient c/o nasal drainage and congestion.

## 2021-12-31 NOTE — ED Provider Notes (Signed)
[facility]    CSN: [id number] Arrival date & time: 12/31/21  1249      History   Chief Complaint Chief Complaint  Patient presents with   Appointment   Cough    HPI Brady Wells is a 41 y.o. male.   Patient presents with nasal congestion and cough that started approximately 4 days ago.  Denies any known sick contacts but reports that he recently traveled on a cruise.  Denies any known fevers.  Denies chest pain, shortness of breath, sore throat, ear pain, nausea, vomiting, diarrhea, abdominal pain.  Patient has taken NyQuil, DayQuil, levocetirizine with minimal improvement.   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 Authorizing Provider  albuterol (VENTOLIN HFA) 108 (90 Base) MCG/ACT inhaler INHALE 2 PUFF BY INHALATION ROUTE EVERY 4 - 6 HOURS AS NEEDED AS NEEDED 05/20/21  Yes Ghumman, Ramandeep, NP  benzonatate (TESSALON) 100 MG capsule Take 1 capsule (100 mg total) by mouth every 8 (eight) hours as needed for cough. 12/31/21  Yes Edmundo Tedesco, Michele Rockers, FNP  cetirizine (ZYRTEC) 10 MG tablet Take 1 tablet (10 mg total) by mouth daily. 09/01/21  Yes Artice Bergerson, Chief [place] E, FNP  EPIPEN 2-PAK 0.3 MG/0.3ML SOAJ injection Inject 0.3 mg into the muscle as needed. 05/20/21  Yes Ghumman, Ramandeep, NP  fluticasone (FLONASE) 50 MCG/ACT nasal spray Place 1 spray into both nostrils daily for 3 days. 12/31/21 01/03/22 Yes Jericka Kadar,  Hildred Alamin E, FNP  fluticasone-salmeterol (ADVAIR DISKUS) 100-50 MCG/ACT AEPB Inhale 1 puff into the lungs 2 (two) times daily. 07/01/21  Yes Ghumman, Ramandeep, NP  ibuprofen (ADVIL) 800 MG tablet Take 1 tablet (800 mg total) by mouth every 8 (eight) hours as needed. 02/20/21  Yes Hayden Rasmussen, MD  levocetirizine (XYZAL) 5 MG tablet TAKE 1 TABLET BY MOUTH EVERY DAY IN THE EVENING 12/17/21  Yes Ghumman, Ramandeep, NP  Multiple Vitamin (MULTIVITAMIN WITH MINERALS) TABS Take 1 tablet by mouth daily.   Yes [provider]  OZEMPIC, 1 MG/DOSE, 4 MG/3ML SOPN INJECT 1MG  INTO THE SKIN ONCE A WEEK 11/27/21  Yes Ghumman, Ramandeep, NP  scopolamine (TRANSDERM-SCOP) 1 MG/3DAYS APPLY 1 TO HAIRLESS AREA BEHIND EAR EVERY 3 DAYS AT LEAST 4 HOURS BEFORE NEEDED EFFECT AS NEEDED 11/27/21  Yes Bary Castilla, NP    Family History Family History  Problem Relation Age of Onset   Healthy Mother    Healthy Father     Social History Social History   Tobacco Use   Smoking status: Never   Smokeless tobacco: Never  Vaping Use   Vaping Use: Never used  Substance Use Topics   Alcohol use: Yes    Comment: socially   Drug use: Never     Allergies   Bee venom   Review of Systems Review of Systems Per HPI  Physical Exam Triage Vital Signs ED Triage Vitals  Enc Vitals Group     BP 12/31/21 1312 119/85     Pulse Rate 12/31/21 1312 78     Resp 12/31/21 1312 18     Temp 12/31/21 1312 98.3 F (36.8 C)     Temp Source 12/31/21 1312 Oral     SpO2 12/31/21 1312 97 %     Weight 12/31/21 1313 234 lb 5.6 oz (106.3 kg)     Height 12/31/21 1313 6\' 1"  (1.854 m)     Head Circumference --      Peak Flow --      Pain Score 12/31/21 1313 0     Pain Loc --      Pain Edu? --      Excl. in [place]? --    No data found.  Updated Vital Signs BP 119/85 (BP Location: Right Arm)    Pulse 78    Temp 98.3 F (36.8 C) (Oral)    Resp 18    Ht 6\' 1"  (1.854 m)    Wt 234 lb 5.6 oz (106.3 kg)    SpO2 97%    BMI  30.92 kg/m   Visual Acuity Right Eye Distance:   Left Eye Distance:   Bilateral Distance:    Right Eye Near:   Left Eye Near:    Bilateral Near:     Physical Exam Constitutional:      General: He is not in acute distress.    Appearance: Normal appearance. He is not toxic-appearing or diaphoretic.  HENT:     Head: Normocephalic and atraumatic.     Right Ear: Tympanic membrane and ear canal normal.     Left Ear: Tympanic membrane and ear canal normal.     Nose: Congestion present.     Mouth/Throat:     Mouth: Mucous membranes are moist.     Pharynx: No posterior oropharyngeal erythema.  Eyes:     Extraocular Movements: Extraocular movements intact.     Conjunctiva/sclera: Conjunctivae normal.     Pupils: Pupils are equal, round, and reactive to light.  Cardiovascular:     Rate and Rhythm: Normal rate and regular rhythm.     Pulses: Normal pulses.     Heart sounds: Normal heart sounds.  Pulmonary:     Effort: Pulmonary effort is normal. No respiratory distress.     Breath sounds: Normal breath sounds. No stridor. No wheezing, rhonchi or rales.  Abdominal:     General: Abdomen is flat. Bowel sounds are normal.     Palpations: Abdomen is soft.  Musculoskeletal:        General: Normal range of motion.     Cervical back: Normal range of motion.  Skin:    General: Skin is warm and dry.  Neurological:     General: No focal deficit present.     Mental Status: He is alert and oriented to person, place, and time. Mental status is at baseline.  Psychiatric:        Mood and Affect: Mood normal.        Behavior: Behavior normal.     UC Treatments / Results  Labs (all labs ordered are listed, but only abnormal results are displayed) Labs Reviewed  COVID-19, FLU A+B NAA    EKG   Radiology No results found.  Procedures Procedures (including critical care time)  Medications Ordered in UC Medications - No data to display  Initial Impression / Assessment and Plan /  UC Course  I have reviewed the triage vital  signs and the nursing notes.  Pertinent labs & imaging results that were available during my care of the patient were reviewed by me and considered in my medical decision making (see chart for details).     Patient presents with symptoms likely from a viral upper respiratory infection. Differential includes bacterial pneumonia, sinusitis, allergic rhinitis, COVID-19, flu. Do not suspect underlying cardiopulmonary process. Symptoms seem unlikely related to ACS, CHF or COPD exacerbations, pneumonia, pneumothorax. Patient is nontoxic appearing and not in need of emergent medical intervention.  COVID-19 and flu test pending.  Recommended symptom control with over the counter medications: Daily oral anti-histamine, Oral decongestant or IN corticosteroid, saline irrigations, cepacol lozenges, Robitussin, Delsym, honey tea.  Sent prescriptions.  Return if symptoms fail to improve in 1-2 weeks or you develop shortness of breath, chest pain, severe headache. Patient states understanding and is agreeable.  Discharged with PCP followup.  Final Clinical Impressions(s) / UC Diagnoses   Final diagnoses:  Viral upper respiratory tract infection with cough     Discharge Instructions      It appears that you have a viral upper respiratory infection that should resolve on its own in the next few days.  You have been prescribed 2 medications to help alleviate symptoms.  COVID-19 and flu test is pending.  We will call if it is positive.    ED Prescriptions     Medication Sig Dispense Auth. Provider   fluticasone (FLONASE) 50 MCG/ACT nasal spray Place 1 spray into both nostrils daily for 3 days. 16 g Toniann Dickerson, Hildred Alamin E, [place]   benzonatate (TESSALON) 100 MG capsule Take 1 capsule (100 mg total) by mouth every 8 (eight) hours as needed for cough. 21 capsule [place], Michele Rockers, [place]      PDMP not reviewed this encounter.   Teodora Medici, [place] 12/31/21 1343

## 2021-12-31 NOTE — Discharge Instructions (Signed)
It appears that you have a viral upper respiratory infection that should resolve on its own in the next few days.  You have been prescribed 2 medications to help alleviate symptoms.  COVID-19 and flu test is pending.  We will call if it is positive.

## 2022-01-01 LAB — COVID-19, FLU A+B NAA
Influenza A, NAA: NOT DETECTED
Influenza B, NAA: NOT DETECTED
SARS-CoV-2, NAA: NOT DETECTED

## 2022-02-24 ENCOUNTER — Ambulatory Visit: Payer: BC Managed Care – PPO | Admitting: Internal Medicine

## 2022-02-24 ENCOUNTER — Other Ambulatory Visit: Payer: Self-pay

## 2022-02-24 ENCOUNTER — Encounter: Payer: Self-pay | Admitting: Internal Medicine

## 2022-02-24 VITALS — BP 118/80 | HR 86 | Temp 97.9°F | Ht 73.0 in | Wt 227.4 lb

## 2022-02-24 DIAGNOSIS — Z7184 Encounter for health counseling related to travel: Secondary | ICD-10-CM

## 2022-02-24 DIAGNOSIS — R7303 Prediabetes: Secondary | ICD-10-CM

## 2022-02-24 DIAGNOSIS — E6609 Other obesity due to excess calories: Secondary | ICD-10-CM | POA: Diagnosis not present

## 2022-02-24 DIAGNOSIS — L219 Seborrheic dermatitis, unspecified: Secondary | ICD-10-CM | POA: Diagnosis not present

## 2022-02-24 DIAGNOSIS — Z6830 Body mass index (BMI) 30.0-30.9, adult: Secondary | ICD-10-CM | POA: Diagnosis not present

## 2022-02-24 DIAGNOSIS — Z789 Other specified health status: Secondary | ICD-10-CM

## 2022-02-24 MED ORDER — BENZONATATE 100 MG PO CAPS: 100.0000 mg | ORAL_CAPSULE | Freq: Three times a day (TID) | ORAL | 0 refills | Status: DC | PRN

## 2022-02-24 MED ORDER — ALBUTEROL SULFATE HFA 108 (90 BASE) MCG/ACT IN AERS: INHALATION_SPRAY | RESPIRATORY_TRACT | 3 refills | Status: DC

## 2022-02-24 MED ORDER — EPIPEN 2-PAK 0.3 MG/0.3ML IJ SOAJ: 0.3000 mg | INTRAMUSCULAR | 3 refills | Status: DC | PRN

## 2022-02-24 MED ORDER — FLUTICASONE-SALMETEROL 100-50 MCG/ACT IN AEPB: 1.0000 | INHALATION_SPRAY | Freq: Two times a day (BID) | RESPIRATORY_TRACT | 2 refills | Status: DC

## 2022-02-24 NOTE — Patient Instructions (Addendum)
Hepatitis A/B vaccine ? ?[facility] ? ?Exercising to Lose Weight ?Getting regular exercise is important for everyone. It is especially important if you are overweight. Being overweight increases your risk of heart disease, stroke, diabetes, high blood pressure, and several types of cancer. Exercising, and reducing the calories you consume, can help you lose weight and improve fitness and health. ?Exercise can be moderate or vigorous intensity. To lose weight, most people need to do a certain amount of moderate or vigorous-intensity exercise each week. ?How can exercise affect me? ?You lose weight when you exercise enough to burn more calories than you eat. Exercise also reduces body fat and builds muscle. The more muscle you have, the more calories you burn. Exercise also: ?Improves mood. ?Reduces stress and tension. ?Improves your overall fitness, flexibility, and endurance. ?Increases bone strength. ?Moderate-intensity exercise ?Moderate-intensity exercise is any activity that gets you moving enough to burn at least three times more energy (calories) than if you were sitting. ?Examples of moderate exercise include: ?Walking a mile in 15 minutes. ?Doing light yard work. ?Biking at an easy pace. ?Most people should get at least 150 minutes of moderate-intensity exercise a week to maintain their body weight. ?Vigorous-intensity exercise ?Vigorous-intensity exercise is any activity that gets you moving enough to burn at least six times more calories than if you were sitting. When you exercise at this intensity, you should be working hard enough that you are not able to carry on a conversation. ?Examples of vigorous exercise include: ?Running. ?Playing a team sport, such as football, basketball, and soccer. ?Jumping rope. ?Most people should get at least 75 minutes a week of vigorous exercise to maintain their body weight. ?What actions can I take to lose weight? ?The amount of exercise you need to lose  weight depends on: ?Your age. ?The type of exercise. ?Any health conditions you have. ?Your overall physical ability. ?Talk to your health care provider about how much exercise you need and what types of activities are safe for you. ?Nutrition ? ?Make changes to your diet as told by your health care provider or diet and nutrition specialist (dietitian). This may include: ?Eating fewer calories. ?Eating more protein. ?Eating less unhealthy fats. ?Eating a diet that includes fresh fruits and vegetables, whole grains, low-fat dairy products, and lean protein. ?Avoiding foods with added fat, salt, and sugar. ?Drink plenty of water while you exercise to prevent dehydration or heat stroke. ?Activity ?Choose an activity that you enjoy and set realistic goals. Your health care provider can help you make an exercise plan that works for you. ?Exercise at a moderate or vigorous intensity most days of the week. ?The intensity of exercise may vary from person to person. You can tell how intense a workout is for you by paying attention to your breathing and heartbeat. Most people will notice their breathing and heartbeat get faster with more intense exercise. ?Do resistance training twice each week, such as: ?Push-ups. ?Sit-ups. ?Lifting weights. ?Using resistance bands. ?Getting short amounts of exercise can be just as helpful as long, structured periods of exercise. If you have trouble finding time to exercise, try doing these things as part of your daily routine: ?Get up, stretch, and walk around every 30 minutes throughout the day. ?Go for a walk during your lunch break. ?Park your car farther away from your destination. ?If you take public transportation, get off one stop early and walk the rest of the way. ?Make phone calls while standing up and walking around. ?Take  the stairs instead of elevators or escalators. ?Wear comfortable clothes and shoes with good support. ?Do not exercise so much that you hurt yourself, feel  dizzy, or get very short of breath. ?Where to find more information ?[place] Department of Health and Human Services: ThisPath.fi ?Centers for Disease Control and Prevention: FootballExhibition.com.br ?Contact a health care provider: ?Before starting a new exercise program. ?If you have questions or concerns about your weight. ?If you have a medical problem that keeps you from exercising. ?Get help right away if: ?You have any of the following while exercising: ?Injury. ?Dizziness. ?Difficulty breathing or shortness of breath that does not go away when you stop exercising. ?Chest pain. ?Rapid heartbeat. ?These symptoms may represent a serious problem that is an emergency. Do not wait to see if the symptoms will go away. Get medical help right away. Call your local emergency services (911 in the [place] Do not drive yourself to the hospital. ?Summary ?Getting regular exercise is especially important if you are overweight. ?Being overweight increases your risk of heart disease, stroke, diabetes, high blood pressure, and several types of cancer. ?Losing weight happens when you burn more calories than you eat. ?Reducing the amount of calories you eat, and getting regular moderate or vigorous exercise each week, helps you lose weight. ?This information is not intended to replace advice given to you by your health care provider. Make sure you discuss any questions you have with your health care provider. ?Document Revised: 01/11/2021 Document Reviewed: 01/11/2021 ?Elsevier Patient Education ? 2022 [employer] ? ?

## 2022-02-24 NOTE — Progress Notes (Signed)
?I,Brady Wells,acting as a scribe for Brady Greenland, MD.,have documented all relevant documentation on the behalf of Brady Greenland, MD,as directed by  Brady Greenland, MD while in the presence of Brady Greenland, MD.  ?This visit occurred during the SARS-CoV-2 public health emergency.  Safety protocols were in place, including screening questions prior to the visit, additional usage of staff PPE, and extensive cleaning of exam room while observing appropriate contact time as indicated for disinfecting solutions. ? ?Subjective:  ?  ? Patient ID: Brady Wells , male    DOB: [birth year] , 41 y.o.   MRN: [id number] ? ? ?Chief Complaint  ?Patient presents with  ? Weight Check  ? ? ?HPI ? ?Patient presents today for a weight and prediabetes check.  He is tolerating weekly semaglutide without any issues. He is trying to exercise five days per week.  ? ?Wt Readings from Last 3 Encounters: ?02/24/22 : 227 lb 6.4 oz (103.1 kg) ?12/31/21 : 234 lb 5.6 oz (106.3 kg) ?10/28/21 : 234 lb 6.4 oz (106.3 kg) ?  ? ?  ? ?Past Medical History:  ?Diagnosis Date  ? Asthma   ? Depression   ?  ? ?Family History  ?Problem Relation Age of Onset  ? Healthy Mother   ? Healthy Father   ? ? ? ?Current Outpatient Medications:  ?  cetirizine (ZYRTEC) 10 MG tablet, Take 1 tablet (10 mg total) by mouth daily., Disp: 30 tablet, Rfl: 0 ?  desonide (DESOWEN) 0.05 % lotion, Apply topically 2 (two) times daily as needed., Disp: 59 mL, Rfl: 0 ?  ibuprofen (ADVIL) 800 MG tablet, Take 1 tablet (800 mg total) by mouth every 8 (eight) hours as needed., Disp: 30 tablet, Rfl: 0 ?  Multiple Vitamin (MULTIVITAMIN WITH MINERALS) TABS, Take 1 tablet by mouth daily., Disp: , Rfl:  ?  scopolamine (TRANSDERM-SCOP) 1 MG/3DAYS, APPLY 1 TO HAIRLESS AREA BEHIND EAR EVERY 3 DAYS AT LEAST 4 HOURS BEFORE NEEDED EFFECT AS NEEDED, Disp: 24 patch, Rfl: 1 ?  albuterol (VENTOLIN HFA) 108 (90 Base) MCG/ACT inhaler, INHALE 2 PUFF BY INHALATION ROUTE EVERY 4 - 6 HOURS AS  NEEDED AS NEEDED, Disp: 18 g, Rfl: 3 ?  benzonatate (TESSALON) 100 MG capsule, Take 1 capsule (100 mg total) by mouth every 8 (eight) hours as needed for cough., Disp: 21 capsule, Rfl: 0 ?  EPIPEN 2-PAK 0.3 MG/0.3ML SOAJ injection, Inject 0.3 mg into the muscle as needed., Disp: 2 each, Rfl: 3 ?  fluticasone (FLONASE) 50 MCG/ACT nasal spray, Place 1 spray into both nostrils daily for 3 days., Disp: 16 g, Rfl: 0 ?  fluticasone-salmeterol (ADVAIR DISKUS) 100-50 MCG/ACT AEPB, Inhale 1 puff into the lungs 2 (two) times daily., Disp: 120 each, Rfl: 2 ?  levocetirizine (XYZAL) 5 MG tablet, TAKE 1 TABLET BY MOUTH EVERY DAY IN THE EVENING (Patient not taking: Reported on 02/24/2022), Disp: 90 tablet, Rfl: 1 ?  Semaglutide, 1 MG/DOSE, (OZEMPIC, 1 MG/DOSE,) 4 MG/3ML SOPN, INJECT 1MG INTO THE SKIN ONCE A WEEK, Disp: 9 mL, Rfl: 1  ? ?Allergies  ?Allergen Reactions  ? Bee Venom Swelling  ?  ? ?Review of Systems  ?Constitutional: Negative.   ?Respiratory: Negative.    ?Cardiovascular: Negative.   ?Gastrointestinal: Negative.   ?Skin:  Positive for rash.  ?     He c/o rash on face. Not sure what has caused it. Red, itchy rash. No blisters noted. He has not tried any OTC meds for relief.   ?  Neurological: Negative.   ?Psychiatric/Behavioral: Negative.     ? ?Today's Vitals  ? 02/24/22 1508  ?BP: 118/80  ?Pulse: 86  ?Temp: 97.9 ?F (36.6 ?C)  ?SpO2: 98%  ?Weight: 227 lb 6.4 oz (103.1 kg)  ?Height: '6\' 1"'  (1.854 m)  ? ?Body mass index is 30 kg/m?.  ?Wt Readings from Last 3 Encounters:  ?02/24/22 227 lb 6.4 oz (103.1 kg)  ?12/31/21 234 lb 5.6 oz (106.3 kg)  ?10/28/21 234 lb 6.4 oz (106.3 kg)  ?  ?Objective:  ?Physical Exam ?Vitals and nursing note reviewed.  ?Constitutional:   ?   Appearance: Normal appearance.  ?HENT:  ?   Head: Normocephalic and atraumatic.  ?Eyes:  ?   Extraocular Movements: Extraocular movements intact.  ?Cardiovascular:  ?   Rate and Rhythm: Normal rate and regular rhythm.  ?   Heart sounds: Normal heart sounds.   ?Pulmonary:  ?   Effort: Pulmonary effort is normal.  ?   Breath sounds: Normal breath sounds.  ?Skin: ?   General: Skin is warm.  ?   Findings: Rash present.  ?   Comments: Erythematous rash w/ flakes nasolabial fold, and in facial hair  ?Neurological:  ?   General: No focal deficit present.  ?   Mental Status: He is alert.  ?Psychiatric:     ?   Mood and Affect: Mood normal.  ?  ? ?   ?Assessment And Plan:  ?   ?1. Prediabetes ?Comments: HIs a1c has been elevated in the past. I will recheck an a1c today. He is encouraged to avoid sugary beverages, including diet drinks.  ?- Hemoglobin A1c ?- Insulin, random(561) ?- TSH ?- BMP8+EGFR ? ?2. Class 1 obesity due to excess calories with serious comorbidity and body mass index (BMI) of 30.0 to 30.9 in adult ?Comments: He will c/w weekly semaglutide. He was congratulated on 7lb weight loss since Feb 2023. He is encouraged to aim for at least 150 minutes of exercise per week. ? ?3. Seborrheic dermatitis ?Comments: I will send rx Desowen lotion to apply to affected areas twice daily prn, should not use for more than 14 days at a time. Will consider Derm referral if persistent. ? ?4. History of measles, mumps, rubella (MMR) vaccination unknown ?- Measles/Mumps/Rubella Immunity ? ?5. Travel advice encounter ?Comments: We discussed  immunizations needed for travel to [place]. He is up to date with Tdap. He declines flu. He will contact [facility] for MMR, Twinrix, rabies vaccines.  ? ?Patient was given opportunity to ask questions. Patient verbalized understanding of the plan and was able to repeat key elements of the plan. All questions were answered to their satisfaction.  ? ?I, Brady Greenland, MD, have reviewed all documentation for this visit. The documentation on 03/06/22 for the exam, diagnosis, procedures, and orders are all accurate and complete.  ? ?IF YOU HAVE BEEN REFERRED TO A SPECIALIST, IT MAY TAKE 1-2 WEEKS TO SCHEDULE/PROCESS THE REFERRAL. IF YOU HAVE NOT HEARD FROM  US/SPECIALIST IN TWO WEEKS, PLEASE GIVE [place] A CALL AT [phone] X [phone].  ? ?THE PATIENT IS ENCOURAGED TO PRACTICE SOCIAL DISTANCING DUE TO THE COVID-19 PANDEMIC.   ?

## 2022-02-25 LAB — BMP8+EGFR
BUN/Creatinine Ratio: 11 (ref 9–20)
BUN: 11 mg/dL (ref 6–24)
CO2: 25 mmol/L (ref 20–29)
Calcium: 9.8 mg/dL (ref 8.7–10.2)
Chloride: 100 mmol/L (ref 96–106)
Creatinine, Ser: 0.99 mg/dL (ref 0.76–1.27)
Glucose: 94 mg/dL (ref 70–99)
Potassium: 3.9 mmol/L (ref 3.5–5.2)
Sodium: 139 mmol/L (ref 134–144)
eGFR: 99 mL/min/{1.73_m2} (ref >59)

## 2022-02-25 LAB — HEMOGLOBIN A1C
Est. average glucose Bld gHb Est-mCnc: 117 mg/dL
Hgb A1c MFr Bld: 5.7 % — ABNORMAL HIGH (ref 4.8–5.6)

## 2022-02-25 LAB — INSULIN, RANDOM: INSULIN: 41.6 u[IU]/mL — ABNORMAL HIGH (ref 2.6–24.9)

## 2022-02-25 LAB — TSH: TSH: 2.25 u[IU]/mL (ref 0.450–4.500)

## 2022-02-25 LAB — MEASLES/MUMPS/RUBELLA IMMUNITY
MUMPS ABS, IGG: 202 AU/mL (ref >10.9)
RUBEOLA AB, IGG: 107 AU/mL (ref >16.4)
Rubella Antibodies, IGG: 1.79 index (ref >0.99)

## 2022-02-26 ENCOUNTER — Encounter: Payer: Self-pay | Admitting: Internal Medicine

## 2022-03-02 ENCOUNTER — Other Ambulatory Visit: Payer: Self-pay

## 2022-03-02 DIAGNOSIS — R7303 Prediabetes: Secondary | ICD-10-CM

## 2022-03-02 MED ORDER — OZEMPIC (1 MG/DOSE) 4 MG/3ML ~~LOC~~ SOPN: PEN_INJECTOR | SUBCUTANEOUS | 1 refills | Status: DC

## 2022-03-06 MED ORDER — DESONIDE 0.05 % EX LOTN: TOPICAL_LOTION | Freq: Two times a day (BID) | CUTANEOUS | 0 refills | Status: DC | PRN

## 2022-03-13 ENCOUNTER — Other Ambulatory Visit: Payer: Self-pay | Admitting: Internal Medicine

## 2022-04-01 ENCOUNTER — Encounter: Payer: BC Managed Care – PPO | Admitting: Nurse Practitioner

## 2022-04-01 ENCOUNTER — Encounter: Payer: BC Managed Care – PPO | Admitting: Internal Medicine

## 2022-05-03 ENCOUNTER — Ambulatory Visit: Payer: BC Managed Care – PPO | Admitting: Internal Medicine

## 2022-05-27 ENCOUNTER — Other Ambulatory Visit: Payer: Self-pay | Admitting: Internal Medicine

## 2022-06-28 ENCOUNTER — Ambulatory Visit: Payer: BC Managed Care – PPO | Admitting: Internal Medicine

## 2022-06-28 ENCOUNTER — Encounter: Payer: Self-pay | Admitting: Internal Medicine

## 2022-06-28 ENCOUNTER — Other Ambulatory Visit (HOSPITAL_COMMUNITY): Payer: Self-pay

## 2022-06-28 VITALS — BP 116/78 | HR 69 | Temp 98.2°F | Ht 73.0 in | Wt 236.6 lb

## 2022-06-28 DIAGNOSIS — E6609 Other obesity due to excess calories: Secondary | ICD-10-CM

## 2022-06-28 DIAGNOSIS — R7303 Prediabetes: Secondary | ICD-10-CM

## 2022-06-28 DIAGNOSIS — Z6831 Body mass index (BMI) 31.0-31.9, adult: Secondary | ICD-10-CM | POA: Diagnosis not present

## 2022-06-28 DIAGNOSIS — L219 Seborrheic dermatitis, unspecified: Secondary | ICD-10-CM

## 2022-06-28 LAB — HEMOGLOBIN A1C
Est. average glucose Bld gHb Est-mCnc: 120 mg/dL
Hgb A1c MFr Bld: 5.8 % — ABNORMAL HIGH (ref 4.8–5.6)

## 2022-06-28 MED ORDER — WEGOVY 1 MG/0.5ML ~~LOC~~ SOAJ
1.0000 mg | SUBCUTANEOUS | 1 refills | Status: DC
  Filled 2022-06-28 – 2022-12-10 (×4): qty 2, 28d supply, fill #0

## 2022-06-28 NOTE — Progress Notes (Signed)
Rich Brave Llittleton,acting as a Education administrator for Maximino Greenland, MD.,have documented all relevant documentation on the behalf of Maximino Greenland, MD,as directed by  Maximino Greenland, MD while in the presence of Maximino Greenland, MD.    Subjective:     Patient ID: Brady Wells , male    DOB: June 28, 1981 , 41 y.o.   MRN: [id number]   Chief Complaint  Patient presents with   Weight Check    HPI  Patient presents today for a weight check. He has been out of weekly semaglutide.  He states he has started to incorporate more exercise into his weekly routine.       Past Medical History:  Diagnosis Date   Asthma    Depression      Family History  Problem Relation Age of Onset   Healthy Mother    Healthy Father      Current Outpatient Medications:    benzonatate (TESSALON) 100 MG capsule, Take 1 capsule (100 mg total) by mouth every 8 (eight) hours as needed for cough., Disp: 21 capsule, Rfl: 0   cetirizine (ZYRTEC) 10 MG tablet, Take 1 tablet (10 mg total) by mouth daily., Disp: 30 tablet, Rfl: 0   desonide (DESOWEN) 0.05 % lotion, APPLY TOPICALLY 2 TIMES DAILY AS NEEDED., Disp: 59 mL, Rfl: 0   fluticasone (FLONASE) 50 MCG/ACT nasal spray, Place 1 spray into both nostrils daily for 3 days., Disp: 16 g, Rfl: 0   levocetirizine (XYZAL) 5 MG tablet, TAKE 1 TABLET BY MOUTH EVERY DAY IN THE EVENING, Disp: 90 tablet, Rfl: 1   Multiple Vitamin (MULTIVITAMIN WITH MINERALS) TABS, Take 1 tablet by mouth daily., Disp: , Rfl:    Semaglutide-Weight Management (WEGOVY) 1 MG/0.5ML SOAJ, Inject 1 mg into the skin once a week., Disp: 2 mL, Rfl: 1   albuterol (VENTOLIN HFA) 108 (90 Base) MCG/ACT inhaler, Inhale 2 puffs into the lungs every 4 (four) hours as needed for wheezing or shortness of breath., Disp: 25.5 each, Rfl: 2   EPIPEN 2-PAK 0.3 MG/0.3ML SOAJ injection, Inject 0.3 mg into the muscle as needed., Disp: 2 each, Rfl: 3   fluticasone-salmeterol (ADVAIR DISKUS) 100-50 MCG/ACT AEPB, Inhale 1  puff into the lungs 2 (two) times daily., Disp: 120 each, Rfl: 2   ibuprofen (ADVIL) 800 MG tablet, Take 1 tablet (800 mg total) by mouth every 8 (eight) hours as needed. (Patient not taking: Reported on 06/28/2022), Disp: 30 tablet, Rfl: 0   Allergies  Allergen Reactions   Bee Venom Swelling     Review of Systems  Constitutional: Negative.   Respiratory: Negative.    Cardiovascular: Negative.   Gastrointestinal: Negative.   Neurological: Negative.   Psychiatric/Behavioral: Negative.       Today's Vitals   06/28/22 1505  BP: 116/78  Pulse: 69  Temp: 98.2 F (36.8 C)  Weight: 236 lb 9.6 oz (107.3 kg)  Height: _0  (1.854 m)  PainSc: 0-No pain   Body mass index is 31.22 kg/m.  Wt Readings from Last 3 Encounters:  06/28/22 236 lb 9.6 oz (107.3 kg)  02/24/22 227 lb 6.4 oz (103.1 kg)  12/31/21 234 lb 5.6 oz (106.3 kg)     Objective:  Physical Exam Vitals and nursing note reviewed.  Constitutional:      Appearance: Normal appearance. He is obese.  HENT:     Head: Normocephalic and atraumatic.  Eyes:     Extraocular Movements: Extraocular movements intact.  Cardiovascular:  Rate and Rhythm: Normal rate and regular rhythm.     Heart sounds: Normal heart sounds.  Pulmonary:     Effort: Pulmonary effort is normal.     Breath sounds: Normal breath sounds.  Musculoskeletal:     Cervical back: Normal range of motion.  Skin:    General: Skin is warm.  Neurological:     General: No focal deficit present.     Mental Status: He is alert.  Psychiatric:        Mood and Affect: Mood normal.         Assessment And Plan:     1. Class 1 obesity due to excess calories without serious comorbidity with body mass index (BMI) of 31.0 to 31.9 in adult Comments: He has gained  9 lbs since his last visit. He confirms no FHx thyroid cancer. I will send rx Wegovy to start PA process. He is encouraged to incorporate more strength training into his daily routine.   2.  Prediabetes Comments: His a1c has been elevated in the past. I will recheck this today. He is encouraged to limit his intake of sweetened beverages and foods.  - CBC no Diff - CMP14+EGFR - Hemoglobin A1c  3. Seborrheic dermatitis Comments: Improved with use of Desowen lotion/selenium sulfide shampoo.    Patient was given opportunity to ask questions. Patient verbalized understanding of the plan and was able to repeat key elements of the plan. All questions were answered to their satisfaction.   I, Maximino Greenland, MD, have reviewed all documentation for this visit. The documentation on 06/28/22 for the exam, diagnosis, procedures, and orders are all accurate and complete.   IF YOU HAVE BEEN REFERRED TO A SPECIALIST, IT MAY TAKE 1-2 WEEKS TO SCHEDULE/PROCESS THE REFERRAL. IF YOU HAVE NOT HEARD FROM US/SPECIALIST IN TWO WEEKS, PLEASE GIVE [place] A CALL AT [phone].   THE PATIENT IS ENCOURAGED TO PRACTICE SOCIAL DISTANCING DUE TO THE COVID-19 PANDEMIC.

## 2022-06-28 NOTE — Patient Instructions (Signed)

## 2022-06-29 LAB — CMP14+EGFR
ALT: 35 IU/L (ref 0–44)
AST: 30 IU/L (ref 0–40)
Albumin/Globulin Ratio: 2.2 (ref 1.2–2.2)
Albumin: 4.9 g/dL (ref 4.1–5.1)
Alkaline Phosphatase: 51 IU/L (ref 44–121)
BUN/Creatinine Ratio: 14 (ref 9–20)
BUN: 15 mg/dL (ref 6–24)
Bilirubin Total: 0.6 mg/dL (ref 0.0–1.2)
CO2: 24 mmol/L (ref 20–29)
Calcium: 9.7 mg/dL (ref 8.7–10.2)
Chloride: 101 mmol/L (ref 96–106)
Creatinine, Ser: 1.04 mg/dL (ref 0.76–1.27)
Globulin, Total: 2.2 g/dL (ref 1.5–4.5)
Glucose: 84 mg/dL (ref 70–99)
Potassium: 3.8 mmol/L (ref 3.5–5.2)
Sodium: 138 mmol/L (ref 134–144)
Total Protein: 7.1 g/dL (ref 6.0–8.5)
eGFR: 93 mL/min/{1.73_m2} (ref >59)

## 2022-06-29 LAB — CBC
Hematocrit: 47.2 % (ref 37.5–51.0)
Hemoglobin: 15.2 g/dL (ref 13.0–17.7)
MCH: 24.9 pg — ABNORMAL LOW (ref 26.6–33.0)
MCHC: 32.2 g/dL (ref 31.5–35.7)
MCV: 77 fL — ABNORMAL LOW (ref 79–97)
Platelets: 303 10*3/uL (ref 150–450)
RBC: 6.1 x10E6/uL — ABNORMAL HIGH (ref 4.14–5.80)
RDW: 15.2 % (ref 11.6–15.4)
WBC: 6.2 10*3/uL (ref 3.4–10.8)

## 2022-07-07 ENCOUNTER — Other Ambulatory Visit: Payer: Self-pay

## 2022-07-07 MED ORDER — ALBUTEROL SULFATE HFA 108 (90 BASE) MCG/ACT IN AERS: 2.0000 | INHALATION_SPRAY | RESPIRATORY_TRACT | 2 refills | Status: DC | PRN

## 2022-07-07 MED ORDER — EPIPEN 2-PAK 0.3 MG/0.3ML IJ SOAJ: 0.3000 mg | INTRAMUSCULAR | 3 refills | Status: DC | PRN

## 2022-07-07 MED ORDER — FLUTICASONE-SALMETEROL 100-50 MCG/ACT IN AEPB: 1.0000 | INHALATION_SPRAY | Freq: Two times a day (BID) | RESPIRATORY_TRACT | 2 refills | Status: DC

## 2022-07-14 ENCOUNTER — Other Ambulatory Visit (HOSPITAL_COMMUNITY): Payer: Self-pay

## 2022-07-19 ENCOUNTER — Telehealth: Payer: Self-pay

## 2022-07-19 NOTE — Telephone Encounter (Signed)
Voicemail full . Unable to get cost of weogvy lowered. Pt should reach out to [facility].

## 2022-07-20 DIAGNOSIS — M25512 Pain in left shoulder: Secondary | ICD-10-CM | POA: Insufficient documentation

## 2022-08-18 ENCOUNTER — Other Ambulatory Visit: Payer: Self-pay | Admitting: Internal Medicine

## 2022-08-18 MED ORDER — LEVOCETIRIZINE DIHYDROCHLORIDE 5 MG PO TABS: ORAL_TABLET | ORAL | 1 refills | Status: DC

## 2022-08-31 ENCOUNTER — Other Ambulatory Visit: Payer: Self-pay

## 2022-08-31 DIAGNOSIS — E6609 Other obesity due to excess calories: Secondary | ICD-10-CM

## 2022-08-31 MED ORDER — DESONIDE 0.05 % EX LOTN: TOPICAL_LOTION | CUTANEOUS | 0 refills | Status: AC

## 2022-09-13 ENCOUNTER — Other Ambulatory Visit: Payer: Self-pay | Admitting: Internal Medicine

## 2022-09-25 IMAGING — DX DG CHEST 2V
2 series · 2 of 2 positions shown · non-contrast
Comparison: 02/20/2021

CLINICAL DATA: Productive cough and chills for 2 days. Decreased
appetite.

EXAM:
CHEST - 2 VIEW

[chest pa]
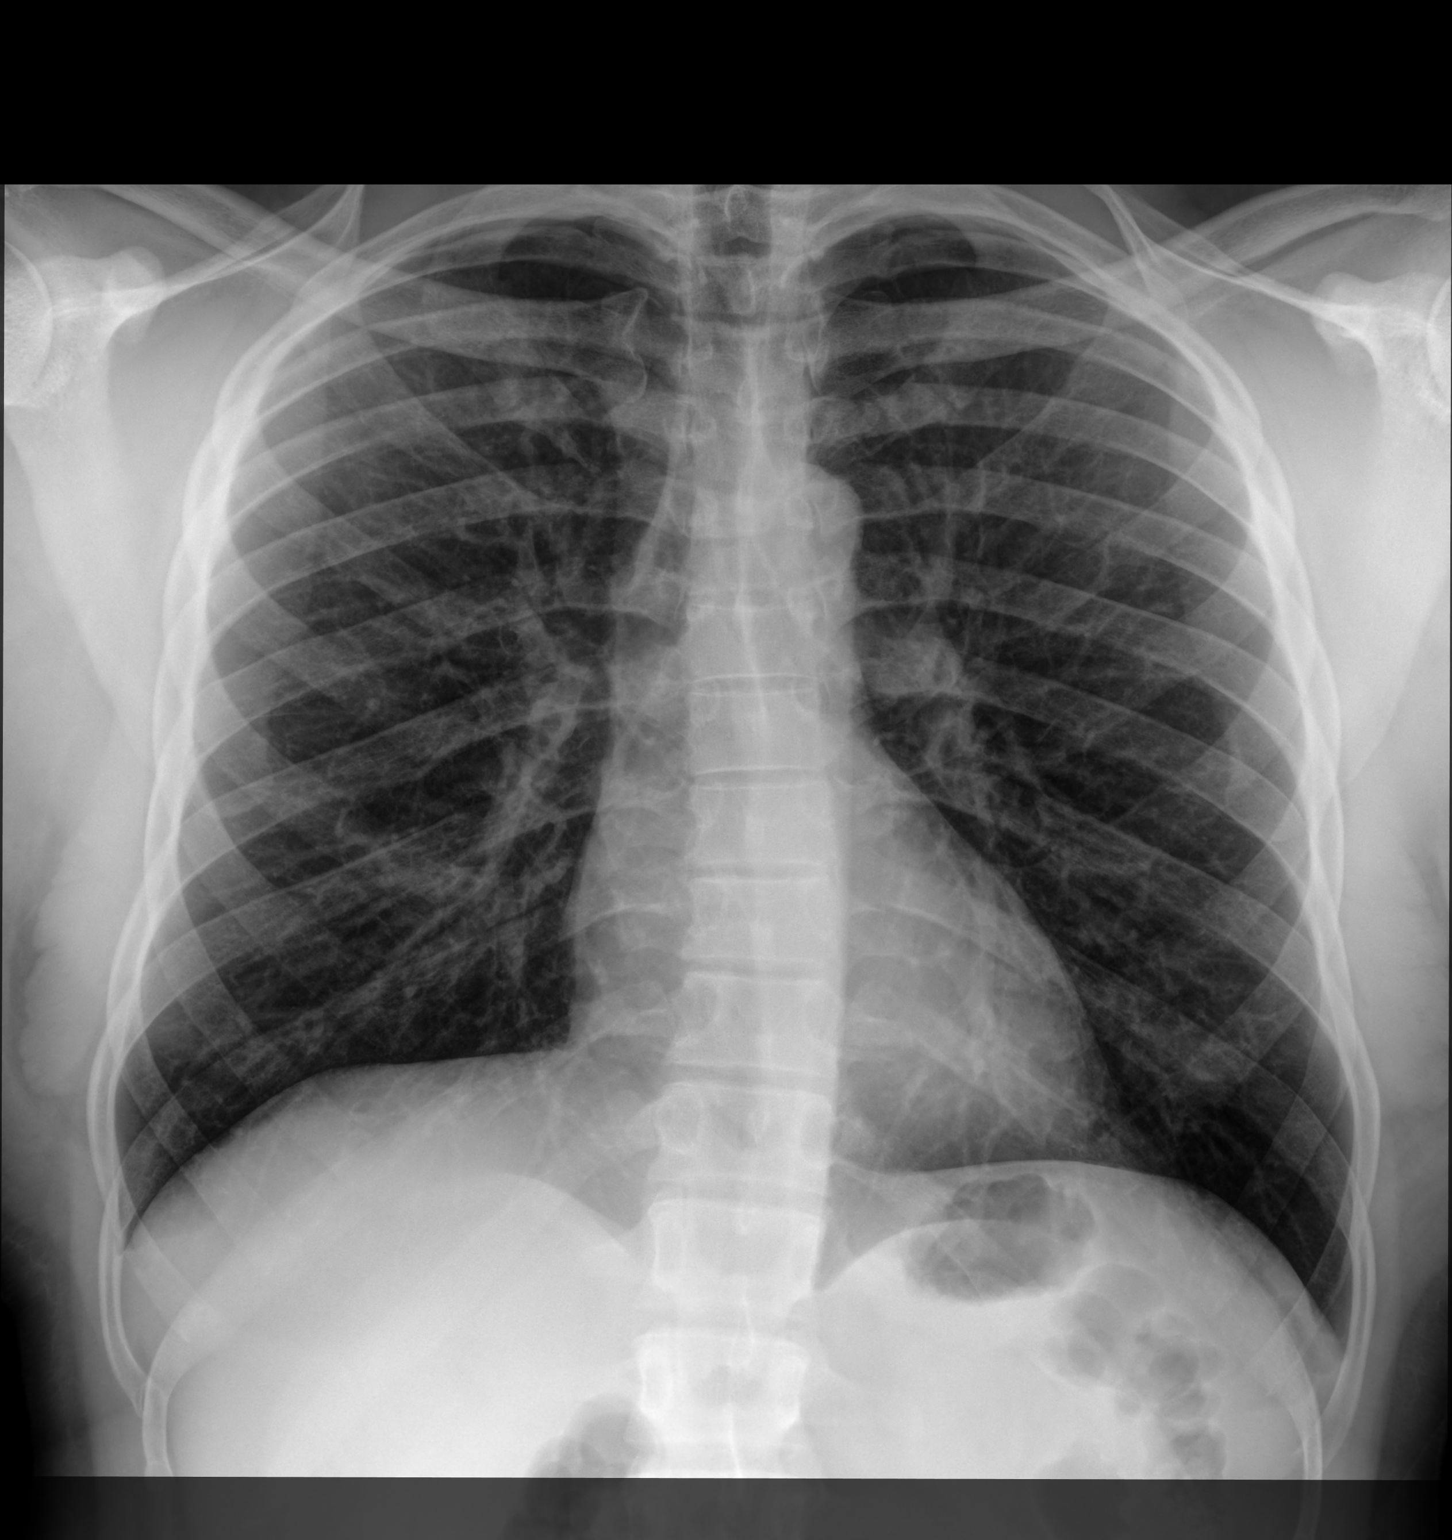

[chest lat]
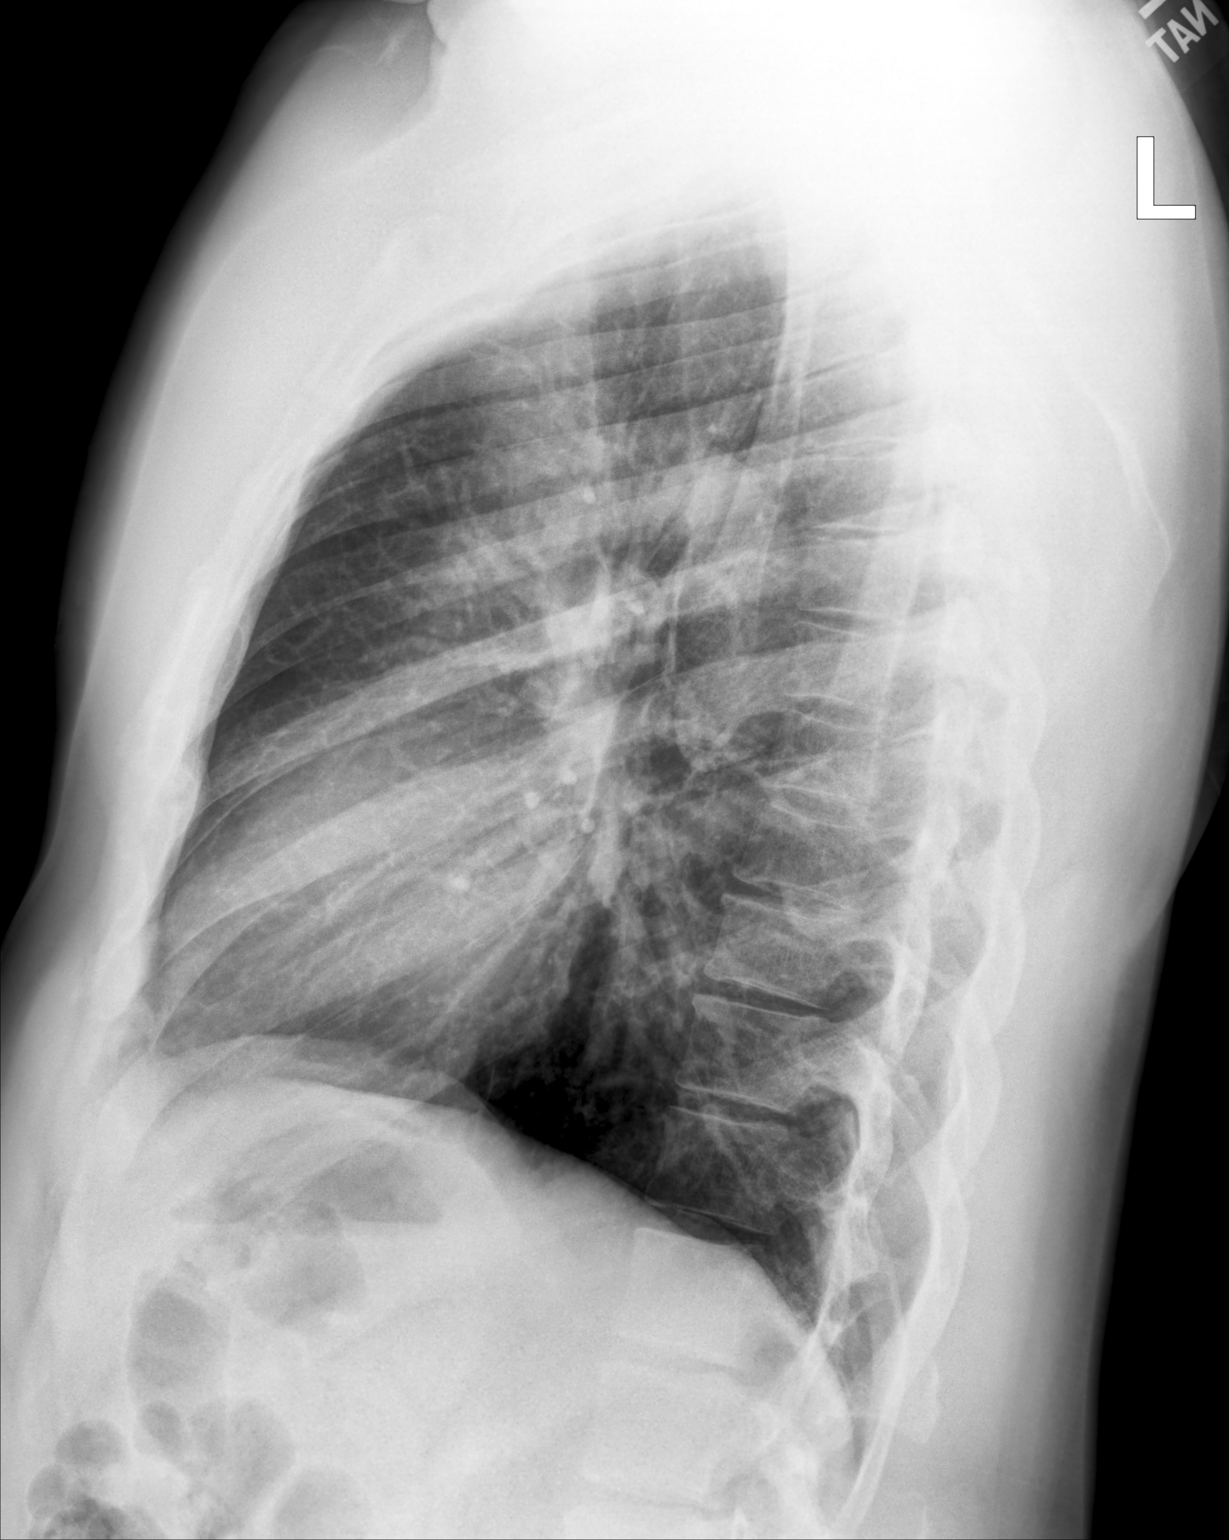

[2 of 2 positions shown; findings below may reference images not displayed]

FINDINGS: The heart size and mediastinal contours are within normal limits.
Both lungs are clear. The visualized skeletal structures are
unremarkable.
IMPRESSION: Normal exam.

## 2022-09-27 ENCOUNTER — Other Ambulatory Visit: Payer: Self-pay

## 2022-09-27 MED ORDER — EPIPEN 2-PAK 0.3 MG/0.3ML IJ SOAJ: 0.3000 mg | INTRAMUSCULAR | 3 refills | Status: DC | PRN

## 2022-09-28 ENCOUNTER — Encounter (INDEPENDENT_AMBULATORY_CARE_PROVIDER_SITE_OTHER): Payer: Self-pay

## 2022-11-09 ENCOUNTER — Ambulatory Visit (INDEPENDENT_AMBULATORY_CARE_PROVIDER_SITE_OTHER): Payer: BC Managed Care – PPO | Admitting: Family Medicine

## 2022-11-09 ENCOUNTER — Encounter (INDEPENDENT_AMBULATORY_CARE_PROVIDER_SITE_OTHER): Payer: Self-pay | Admitting: Family Medicine

## 2022-11-09 VITALS — BP 121/80 | HR 63 | Temp 98.0°F | Ht 72.0 in | Wt 251.0 lb

## 2022-11-09 DIAGNOSIS — Z6834 Body mass index (BMI) 34.0-34.9, adult: Secondary | ICD-10-CM

## 2022-11-09 DIAGNOSIS — R7303 Prediabetes: Secondary | ICD-10-CM | POA: Diagnosis not present

## 2022-11-09 DIAGNOSIS — Z0289 Encounter for other administrative examinations: Secondary | ICD-10-CM

## 2022-11-09 DIAGNOSIS — E669 Obesity, unspecified: Secondary | ICD-10-CM | POA: Diagnosis not present

## 2022-11-09 DIAGNOSIS — G4726 Circadian rhythm sleep disorder, shift work type: Secondary | ICD-10-CM | POA: Insufficient documentation

## 2022-11-10 ENCOUNTER — Other Ambulatory Visit (HOSPITAL_COMMUNITY): Payer: Self-pay

## 2022-11-17 NOTE — Progress Notes (Signed)
Office: [phone]  /  Fax: [phone]   Initial Visit  Brady Wells was seen in clinic today to evaluate for obesity. He is interested in losing weight to improve overall health and reduce the risk of weight related complications. He presents today to review program treatment options, initial physical assessment, and evaluation.     He was referred by: PCP  When asked what else they would like to accomplish? He states: Improve existing medical conditions, Lose a target amount of weight : 30-40 lbs, and Other: more sedentary, 11 years at work, she is working nights, poor sleep at night.   When asked how has your weight affected you? He states: Contributed to orthopedic problems or mobility issues  Some associated conditions: Prediabetes  Contributing factors: Disruption of circadian rhythm and Reduced physical activity  Weight promoting medications identified: Other: n/a  Current nutrition plan: None  Current level of physical activity: None and Strength training3-4 times per week.   Current or previous pharmacotherapy: GLP-1, took Ozempic for 6 months, 25 lb weight loss.   Response to medication: Lost weight initially but was unable to sustain weight loss  Past medical history includes:   Past Medical History:  Diagnosis Date   Asthma    Depression    Objective:   BP 121/80   Pulse 63   Temp 98 F (36.7 C)   Ht 6' (1.829 m)   Wt 251 lb (113.9 kg)   SpO2 96%   BMI 34.04 kg/m  He was weighed on the bioimpedance scale: Body mass index is 34.04 kg/m.  ,Visceral Fat Rating:15, Body Fat%:30.8  General:  Alert, oriented and cooperative. Patient is in no acute distress.  Respiratory: Normal respiratory effort, no problems with respiration noted  Extremities: Normal range of motion.    Mental Status: Normal mood and affect. Normal behavior. Normal judgment and thought content.   Assessment and Plan:  1. Pre-diabetes A1c 5.8 on 06/28/2022.  Patient is concerned  about risk of type 2 diabetes mellitus.  Patient currently has a sedentary job with overconsumption of carbs and sugar.  Check A1c, fasting insulin next visit.  Plan to increase cardio to 3 days/week.  2. Shift work sleep disorder Patient has worked nights as a Loss adjuster, chartered for 10 years.  Less physical activity and inadequate sleep due to disruptions.  Sleeping through the day has contributed to fatigue and weight gain.  Patient will work to improve daytime sleep to 6 to 7 hours.  3. Obesity,current BMI 34.1 1) Reviewed today's Bioimpedance results.  2) Discussed possible allergy referral given history of food allergies.   We reviewed weight, biometrics, associated medical conditions and contributing factors with patient. He would benefit from weight loss therapy via a modified calorie, low-carb, high-protein nutritional plan tailored to their REE (resting energy expenditure) which will be determined by indirect calorimetry.  We will also assess for cardiometabolic risk and nutritional derangements via fasting serologies at his next appointment.     Obesity Treatment / Action Plan:  Patient will work on garnering support from family and friends to begin weight loss journey. Will complete provided nutritional and psychosocial assessment questionnaire before the next appointment. Will be scheduled for indirect calorimetry to determine resting energy expenditure in a fasting state.  This will allow [place] to create a reduced calorie, high-protein meal plan to promote loss of fat mass while preserving muscle mass. Was counseled on nutritional approaches to weight loss and benefits of complex carbs and high quality protein  as part of nutritional weight management. Was counseled on pharmacotherapy and role as an adjunct in weight management.   Obesity Education Performed Today:  He was weighed on the bioimpedance scale and results were discussed and documented in the synopsis.  We discussed obesity as  a disease and the importance of a more detailed evaluation of all the factors contributing to the disease.  We discussed the importance of long term lifestyle changes which include nutrition, exercise and behavioral modifications as well as the importance of customizing this to his specific health and social needs.  We discussed the benefits of reaching a healthier weight to alleviate the symptoms of existing conditions and reduce the risks of the biomechanical, metabolic and psychological effects of obesity.  Eliab D Chauvin appears to be in the action stage of change and states they are ready to start intensive lifestyle modifications and behavioral modifications.  30 minutes was spent today on this visit including the above counseling, pre-visit chart review, and post-visit documentation.  Reviewed by clinician on day of visit: allergies, medications, problem list, medical history, surgical history, family history, social history, and previous encounter notes.  I, Malcolm Metro, am acting as Energy manager for Seymour Bars, DO.  I have reviewed the above documentation for accuracy and completeness, and I agree with the above. Glennis Brink, DO

## 2022-12-03 ENCOUNTER — Other Ambulatory Visit: Payer: Self-pay | Admitting: Internal Medicine

## 2022-12-11 ENCOUNTER — Other Ambulatory Visit: Payer: Self-pay

## 2022-12-13 ENCOUNTER — Other Ambulatory Visit: Payer: Self-pay

## 2022-12-13 ENCOUNTER — Other Ambulatory Visit (HOSPITAL_COMMUNITY): Payer: Self-pay

## 2022-12-14 ENCOUNTER — Other Ambulatory Visit: Payer: Self-pay

## 2022-12-14 ENCOUNTER — Encounter: Payer: Self-pay | Admitting: Pharmacist

## 2022-12-15 ENCOUNTER — Other Ambulatory Visit (HOSPITAL_COMMUNITY): Payer: Self-pay

## 2022-12-16 ENCOUNTER — Other Ambulatory Visit: Payer: Self-pay

## 2022-12-18 ENCOUNTER — Other Ambulatory Visit: Payer: Self-pay | Admitting: Internal Medicine

## 2022-12-30 ENCOUNTER — Ambulatory Visit (INDEPENDENT_AMBULATORY_CARE_PROVIDER_SITE_OTHER): Payer: BC Managed Care – PPO | Admitting: Family Medicine

## 2022-12-30 ENCOUNTER — Encounter (INDEPENDENT_AMBULATORY_CARE_PROVIDER_SITE_OTHER): Payer: Self-pay | Admitting: Family Medicine

## 2022-12-30 VITALS — BP 121/78 | HR 61 | Temp 97.9°F | Ht 72.0 in | Wt 254.0 lb

## 2022-12-30 DIAGNOSIS — R0602 Shortness of breath: Secondary | ICD-10-CM | POA: Diagnosis not present

## 2022-12-30 DIAGNOSIS — Z6834 Body mass index (BMI) 34.0-34.9, adult: Secondary | ICD-10-CM

## 2022-12-30 DIAGNOSIS — E669 Obesity, unspecified: Secondary | ICD-10-CM | POA: Diagnosis not present

## 2022-12-30 DIAGNOSIS — I440 Atrioventricular block, first degree: Secondary | ICD-10-CM | POA: Diagnosis not present

## 2022-12-30 DIAGNOSIS — R7303 Prediabetes: Secondary | ICD-10-CM | POA: Diagnosis not present

## 2022-12-30 DIAGNOSIS — Z1331 Encounter for screening for depression: Secondary | ICD-10-CM

## 2022-12-30 DIAGNOSIS — R5383 Other fatigue: Secondary | ICD-10-CM | POA: Insufficient documentation

## 2023-01-01 LAB — CBC WITH DIFFERENTIAL/PLATELET
Basophils Absolute: 0.1 10*3/uL (ref 0.0–0.2)
Basos: 1 %
EOS (ABSOLUTE): 0.1 10*3/uL (ref 0.0–0.4)
Eos: 2 %
Hematocrit: 44.5 % (ref 37.5–51.0)
Hemoglobin: 14.7 g/dL (ref 13.0–17.7)
Immature Grans (Abs): 0 10*3/uL (ref 0.0–0.1)
Immature Granulocytes: 0 %
Lymphocytes Absolute: 2.5 10*3/uL (ref 0.7–3.1)
Lymphs: 44 %
MCH: 25.8 pg — ABNORMAL LOW (ref 26.6–33.0)
MCHC: 33 g/dL (ref 31.5–35.7)
MCV: 78 fL — ABNORMAL LOW (ref 79–97)
Monocytes Absolute: 0.4 10*3/uL (ref 0.1–0.9)
Monocytes: 8 %
Neutrophils Absolute: 2.5 10*3/uL (ref 1.4–7.0)
Neutrophils: 45 %
Platelets: 331 10*3/uL (ref 150–450)
RBC: 5.69 x10E6/uL (ref 4.14–5.80)
RDW: 13.7 % (ref 11.6–15.4)
WBC: 5.6 10*3/uL (ref 3.4–10.8)

## 2023-01-01 LAB — COMPREHENSIVE METABOLIC PANEL
ALT: 51 IU/L — ABNORMAL HIGH (ref 0–44)
AST: 42 IU/L — ABNORMAL HIGH (ref 0–40)
Albumin/Globulin Ratio: 1.9 (ref 1.2–2.2)
Albumin: 4.6 g/dL (ref 4.1–5.1)
Alkaline Phosphatase: 53 IU/L (ref 44–121)
BUN/Creatinine Ratio: 11 (ref 9–20)
BUN: 13 mg/dL (ref 6–24)
Bilirubin Total: 0.6 mg/dL (ref 0.0–1.2)
CO2: 23 mmol/L (ref 20–29)
Calcium: 9.8 mg/dL (ref 8.7–10.2)
Chloride: 101 mmol/L (ref 96–106)
Creatinine, Ser: 1.14 mg/dL (ref 0.76–1.27)
Globulin, Total: 2.4 g/dL (ref 1.5–4.5)
Glucose: 108 mg/dL — ABNORMAL HIGH (ref 70–99)
Potassium: 4.1 mmol/L (ref 3.5–5.2)
Sodium: 141 mmol/L (ref 134–144)
Total Protein: 7 g/dL (ref 6.0–8.5)
eGFR: 83 mL/min/{1.73_m2} (ref >59)

## 2023-01-01 LAB — TSH: TSH: 2.04 u[IU]/mL (ref 0.450–4.500)

## 2023-01-01 LAB — LIPID PANEL
Chol/HDL Ratio: 3.1 ratio (ref 0.0–5.0)
Cholesterol, Total: 173 mg/dL (ref 100–199)
HDL: 56 mg/dL (ref >39)
LDL Chol Calc (NIH): 100 mg/dL — ABNORMAL HIGH (ref 0–99)
Triglycerides: 91 mg/dL (ref 0–149)
VLDL Cholesterol Cal: 17 mg/dL (ref 5–40)

## 2023-01-01 LAB — T4, FREE: Free T4: 1.21 ng/dL (ref 0.82–1.77)

## 2023-01-01 LAB — HEMOGLOBIN A1C
Est. average glucose Bld gHb Est-mCnc: 131 mg/dL
Hgb A1c MFr Bld: 6.2 % — ABNORMAL HIGH (ref 4.8–5.6)

## 2023-01-01 LAB — INSULIN, RANDOM: INSULIN: 17.8 u[IU]/mL (ref 2.6–24.9)

## 2023-01-01 LAB — VITAMIN B12: Vitamin B-12: 796 pg/mL (ref 232–1245)

## 2023-01-01 LAB — VITAMIN D 25 HYDROXY (VIT D DEFICIENCY, FRACTURES): Vit D, 25-Hydroxy: 30.4 ng/mL (ref 30.0–100.0)

## 2023-01-13 ENCOUNTER — Ambulatory Visit (INDEPENDENT_AMBULATORY_CARE_PROVIDER_SITE_OTHER): Payer: BC Managed Care – PPO | Admitting: Family Medicine

## 2023-01-13 ENCOUNTER — Encounter (INDEPENDENT_AMBULATORY_CARE_PROVIDER_SITE_OTHER): Payer: Self-pay | Admitting: Family Medicine

## 2023-01-13 VITALS — BP 132/78 | HR 69 | Temp 98.3°F | Ht 72.0 in | Wt 253.0 lb

## 2023-01-13 DIAGNOSIS — R7303 Prediabetes: Secondary | ICD-10-CM

## 2023-01-13 DIAGNOSIS — E669 Obesity, unspecified: Secondary | ICD-10-CM | POA: Diagnosis not present

## 2023-01-13 DIAGNOSIS — Z6834 Body mass index (BMI) 34.0-34.9, adult: Secondary | ICD-10-CM | POA: Diagnosis not present

## 2023-01-13 DIAGNOSIS — Z6833 Body mass index (BMI) 33.0-33.9, adult: Secondary | ICD-10-CM | POA: Insufficient documentation

## 2023-01-13 DIAGNOSIS — E559 Vitamin D deficiency, unspecified: Secondary | ICD-10-CM | POA: Diagnosis not present

## 2023-01-13 DIAGNOSIS — E88819 Insulin resistance, unspecified: Secondary | ICD-10-CM

## 2023-01-13 MED ORDER — VITAMIN D (ERGOCALCIFEROL) 1.25 MG (50000 UNIT) PO CAPS: 50000.0000 [IU] | ORAL_CAPSULE | ORAL | 0 refills | Status: DC

## 2023-01-13 NOTE — Assessment & Plan Note (Signed)
Reviewed lab with patient Prediabetes stable Doing well on prescribed diet with low sugar intake Recheck A1c in 3 to 4 months Doing well with both cardio and resistance training 5 days a week  Lab Results  Component Value Date   HGBA1C 6.2 (H) 12/30/2022

## 2023-01-13 NOTE — Assessment & Plan Note (Signed)
New Last vitamin D Lab Results  Component Value Date   VD25OH 30.4 12/30/2022  Reviewed lab with patient Discusse medical problems from vitamin D deficiency Begin prescription vitamin D 50,000 IU once weekly, #5 no refills Recheck level in 4 months

## 2023-01-13 NOTE — Assessment & Plan Note (Signed)
Reviewed lab with patient today.  Fasting insulin is mildly elevated.  Discussed the pathogenesis of insulin resistance and how it relates to adiposity and hyperphasia.  Doing well with a low sugar/low carbohydrate diet along with regular exercise.  Declined use of metformin.  Recheck fasting insulin in 3 to 4 months.

## 2023-01-13 NOTE — Progress Notes (Signed)
Office: [phone]  /  Fax: [phone]  WEIGHT SUMMARY AND BIOMETRICS  Medical Weight Loss Height: 6' (1.829 m) Weight: 253 lb (114.8 kg) Temp: 98.3 F (36.8 C) Pulse Rate: 69 BP: 132/78 SpO2: 96 % Fasting: no Labs: no Today's Visit #: 2 Weight at Last VIsit: 251 Weight Lost Since Last Visit: +3 lbs  Body Fat %: 30.7 % Fat Mass (lbs): 77.8 lbs Muscle Mass (lbs): 167.2 lbs Total Body Water (lbs): 127.2 lbs Visceral Fat Rating : 15 Total Weight Loss (lbs): 0 lb (0 kg)    HPI  Chief Complaint: OBESITY  Brady Wells is here to discuss his progress with his obesity treatment plan. He is on the the Category 4 Plan and states he is following his eating plan approximately 90 % of the time. He states he is exercising 60 minutes 6 times per week.   Interval History:  Since last office visit he up 3 lb Upset about weight gain despite diet and exercise changes Sleeping better and drinking more water Cut back on sweets Denies hunger or cravings Averaging 12000->20,000 steps/ day   Pharmacotherapy: none  PHYSICAL EXAM:  Blood pressure 132/78, pulse 69, temperature 98.3 F (36.8 C), height 6' (1.829 m), weight 253 lb (114.8 kg), SpO2 96 %. Body mass index is 34.31 kg/m.  General: He is overweight, cooperative, alert, well developed, and in no acute distress. PSYCH: Has normal mood, affect and thought process.   HEENT: EOMI, sclerae are anicteric. Lungs: Normal breathing effort, no conversational dyspnea. Extremities: No edema.  Neurologic: No gross sensory or motor deficits. No tremors or fasciculations noted.    DIAGNOSTIC DATA REVIEWED:  BMET    Component Value Date/Time   NA 141 12/30/2022 0956   K 4.1 12/30/2022 0956   CL 101 12/30/2022 0956   CO2 23 12/30/2022 0956   GLUCOSE 108 (H) 12/30/2022 0956   GLUCOSE 88 08/16/2019 0000   BUN 13 12/30/2022 0956   CREATININE 1.14 12/30/2022 0956   CREATININE 1.06 08/16/2019 0000   CALCIUM 9.8 12/30/2022 0956    GFRNONAA 89 08/16/2019 0000   GFRAA 103 08/16/2019 0000   Lab Results  Component Value Date   HGBA1C 6.2 (H) 12/30/2022   HGBA1C 6.0 02/21/2019   Lab Results  Component Value Date   INSULIN 17.8 12/30/2022   INSULIN 82.3 (H) 03/26/2021   Lab Results  Component Value Date   TSH 2.040 12/30/2022   CBC    Component Value Date/Time   WBC 5.6 12/30/2022 0956   WBC 5.7 03/26/2021 1224   RBC 5.69 12/30/2022 0956   RBC 5.92 (H) 03/26/2021 1224   HGB 14.7 12/30/2022 0956   HCT 44.5 12/30/2022 0956   PLT 331 12/30/2022 0956   MCV 78 (L) 12/30/2022 0956   MCH 25.8 (L) 12/30/2022 0956   MCH 25.5 (L) 03/26/2021 1224   MCHC 33.0 12/30/2022 0956   MCHC 32.6 03/26/2021 1224   RDW 13.7 12/30/2022 0956   Iron Studies No results found for: "IRON", "TIBC", "FERRITIN", "IRONPCTSAT" Lipid Panel     Component Value Date/Time   CHOL 173 12/30/2022 0956   TRIG 91 12/30/2022 0956   HDL 56 12/30/2022 0956   CHOLHDL 3.1 12/30/2022 0956   CHOLHDL 3.2 03/26/2021 1224   LDLCALC 100 (H) 12/30/2022 0956   LDLCALC 95 03/26/2021 1224   Hepatic Function Panel     Component Value Date/Time   PROT 7.0 12/30/2022 0956   ALBUMIN 4.6 12/30/2022 0956   AST 42 (H) 12/30/2022  0956   ALT 51 (H) 12/30/2022 0956   ALKPHOS 53 12/30/2022 0956   BILITOT 0.6 12/30/2022 0956      Component Value Date/Time   TSH 2.040 12/30/2022 0956   Nutritional Lab Results  Component Value Date   VD25OH 30.4 12/30/2022   VD25OH 39.0 08/26/2021   VD25OH 37 03/26/2021     ASSESSMENT AND PLAN  TREATMENT PLAN FOR OBESITY:  Recommended Dietary Goals  Brady Wells is currently in the action stage of change. As such, his goal is to continue weight management plan. He has agreed to keeping a food journal and adhering to recommended goals of 2200 calories and 140 protein.  Behavioral Intervention  We discussed the following Behavioral Modification Strategies today: increasing lean protein intake, increasing  vegetables, increasing fiber rich foods, increase water intake, work on meal planning and easy cooking plans, and think about ways to increase physical activity.  Additional resources provided today: NA  Recommended Physical Activity Goals  Brady Wells has been advised to work up to 150 minutes of moderate intensity aerobic activity a week and strengthening exercises 2-3 times per week for cardiovascular health, weight loss maintenance and preservation of muscle mass.   He has agreed to continue physical activity as is.    Pharmacotherapy We discussed various medication options to help Brady Wells with his weight loss efforts and we both agreed to none.  ASSOCIATED CONDITIONS ADDRESSED TODAY  Pre-diabetes Assessment & Plan: Reviewed lab with patient Prediabetes stable Doing well on prescribed diet with low sugar intake Recheck A1c in 3 to 4 months Doing well with both cardio and resistance training 5 days a week  Lab Results  Component Value Date   HGBA1C 6.2 (H) 12/30/2022      Generalized obesity  BMI 34.0-34.9,adult  Insulin resistance Assessment & Plan: Reviewed lab with patient today.  Fasting insulin is mildly elevated.  Discussed the pathogenesis of insulin resistance and how it relates to adiposity and hyperphasia.  Doing well with a low sugar/low carbohydrate diet along with regular exercise.  Declined use of metformin.  Recheck fasting insulin in 3 to 4 months.   Vitamin D deficiency Assessment & Plan: New Last vitamin D Lab Results  Component Value Date   VD25OH 30.4 12/30/2022  Reviewed lab with patient Discusse medical problems from vitamin D deficiency Begin prescription vitamin D 50,000 IU once weekly, #5 no refills Recheck level in 4 months   Other orders -     Vitamin D (Ergocalciferol); Take 1 capsule (50,000 Units total) by mouth every 7 (seven) days.  Dispense: 5 capsule; Refill: 0      No follow-ups on file.Marland Kitchen He was informed of the importance of  frequent follow up visits to maximize his success with intensive lifestyle modifications for his multiple health conditions.   ATTESTASTION STATEMENTS:  Reviewed by clinician on day of visit: allergies, medications, problem list, medical history, surgical history, family history, social history, and previous encounter notes.   Time spent on visit including pre-visit chart review and post-visit care and charting was 30 minutes.    Dell Ponto, DO

## 2023-01-13 NOTE — Assessment & Plan Note (Signed)
Given patient's high-level physical activity, it appears he may be under eating.  Will increase his calories to 2200/day including 140 g of protein daily.  Recommend use of the my fitness pal app.

## 2023-01-22 NOTE — Progress Notes (Unsigned)
Chief Complaint:   OBESITY Brady Wells (MR# [id number]) is a 42 y.o. male who presents for evaluation and treatment of obesity and related comorbidities. Current BMI is Body mass index is 34.04 kg/m. Brady Wells has been struggling with his weight for many years and has been unsuccessful in either losing weight, maintaining weight loss, or reaching his healthy weight goal.  [name] is currently in the action stage of change and ready to dedicate time achieving and maintaining a healthier weight. Brady Wells is interested in becoming our patient and working on intensive lifestyle modifications including (but not limited to) diet and exercise for weight loss.  Lives alone. Brady Wells would like to see 30 lbs of weight loss. His girlfriend does the grocery shopping. 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 Pt snacks at work and craves sweets. He drinks some juice and black coffee.  Brady Wells's habits were reviewed today and are as follows: his desired weight loss is 34 lbs, he started gaining weight 2-3 years ago, his heaviest weight ever was 265 pounds, and he is frequently drinking liquids with calories.  Depression Screen Brady Wells's Food and Mood (modified PHQ-9) score was 6.  Subjective:   1. Other fatigue Brady Wells admits to daytime somnolence and admits to waking up still tired. Patient has a history of symptoms of daytime fatigue and morning fatigue. Brady Wells generally gets 6 or 7 hours of sleep per night, and states that he has poor sleep quality. Snoring "NOT SURE" present. Apneic episodes are present. Epworth Sleepiness Score is 1.  EKG: 1st degree AV block, unchanged.  2. SOBOE (shortness of breath on exertion) [name] notes increasing shortness of breath with exercising and seems to be worsening over time with weight gain. He notes getting out of breath sooner with activity than he used to. This has gotten worse recently.  [name] denies shortness of breath at rest or orthopnea.  Expected BMR 2417 Actual BMR 2160= less than expected  3. Pre-diabetes 06/28/2022 A1c 5.8 Noble tried [employer] last year. He has some weight loss but had flaky skin. He never tried Metformin. Pt has questionable family history of diabetes.  4. 1st degree AV block Newly diagnosed with cardiology notes from Dr. Maylene Roes reviewed- 12/07/22. Pt asymptomatic.  Assessment/Plan:   1. Other fatigue Dishon does feel that his weight is causing his energy to be lower than it should be. Fatigue may be related to obesity, depression or many other causes. Labs will be ordered, and in the meanwhile, [name] will focus on self care including making healthy food choices, increasing physical activity and focusing on stress reduction.  Lab/Orders today or future: - EKG 12-Lead - VITAMIN D 25 Hydroxy (Vit-D Deficiency, Fractures) - TSH - T4, free - Lipid panel - Insulin, random - Hemoglobin A1c - Comprehensive metabolic panel - Vitamin 123456 - CBC with Differential/Platelet  2. SOBOE (shortness of breath on exertion) Brady Wells does feel that he gets out of breath more easily that he used to when he exercises. Brady Wells's shortness of breath appears to be obesity related and exercise induced. He has agreed to work on weight loss and gradually increase exercise to treat his exercise induced shortness of breath. Will continue to monitor closely.  3. Pre-diabetes Limit refined carbohydrates and added sugars.  Lab/Orders today or future: - Insulin, random - Hemoglobin A1c  4. 1st degree AV block No further  workup needed for 1st degree  AV block.  - EKG 12-Lead  5. Depression screen Brady Wells had a positive depression screening. Depression is commonly associated with obesity and often results in emotional eating behaviors. We will monitor this closely and work on CBT to help improve the non-hunger eating patterns. Referral to Psychology may be required if no  improvement is seen as he continues in our clinic.  6. Generalized obesity 7. BMI 34.0-34.9,adult Trade out high sugar drinks for sugar free options. Can add one protein shake daily as a snack  Brady Wells is currently in the action stage of change and his goal is to continue with weight loss efforts. I recommend Brady Wells begin the structured treatment plan as follows:  He has agreed to the Category 4 Plan with 130 grams protein.  Exercise goals:  Walking and weight training 5 days a week.    Behavioral modification strategies: increasing lean protein intake, increasing vegetables, increasing water intake, decreasing liquid calories, decreasing eating out, no skipping meals, meal planning and cooking strategies, keeping healthy foods in the home, better snacking choices, and planning for success.  He was informed of the importance of frequent follow-up visits to maximize his success with intensive lifestyle modifications for his multiple health conditions. He was informed we would discuss his lab results at his next visit unless there is a critical issue that needs to be addressed sooner. Brady Wells agreed to keep his next visit at the agreed upon time to discuss these results.  Objective:   Blood pressure 121/78, pulse 61, temperature 97.9 F (36.6 C), height 6' (1.829 m), SpO2 98 %. Body mass index is 34.04 kg/m.  EKG: Sinus rhythm, rate 68- 1st degree AV block, unchanged.  Indirect Calorimeter completed today shows a VO2 of 313 and a REE of 2160.  His calculated basal metabolic rate is [id number] thus his basal metabolic rate is worse than expected.  General: Cooperative, alert, well developed, in no acute distress. HEENT: Conjunctivae and lids unremarkable. Cardiovascular: Regular rhythm.  Lungs: Normal work of breathing. Neurologic: No focal deficits.   Lab Results  Component Value Date   CREATININE 1.14 12/30/2022   BUN 13 12/30/2022   NA 141 12/30/2022   K 4.1 12/30/2022   CL 101  12/30/2022   CO2 23 12/30/2022   Lab Results  Component Value Date   ALT 51 (H) 12/30/2022   AST 42 (H) 12/30/2022   ALKPHOS 53 12/30/2022   BILITOT 0.6 12/30/2022   Lab Results  Component Value Date   HGBA1C 6.2 (H) 12/30/2022   HGBA1C 5.8 (H) 06/28/2022   HGBA1C 5.7 (H) 02/24/2022   HGBA1C 6.2 (H) 10/28/2021   HGBA1C 6.1 (H) 08/26/2021   Lab Results  Component Value Date   INSULIN 17.8 12/30/2022   INSULIN 41.6 (H) 02/24/2022   INSULIN 82.3 (H) 03/26/2021   Lab Results  Component Value Date   TSH 2.040 12/30/2022   Lab Results  Component Value Date   CHOL 173 12/30/2022   HDL 56 12/30/2022   LDLCALC 100 (H) 12/30/2022   TRIG 91 12/30/2022   CHOLHDL 3.1 12/30/2022   Lab Results  Component Value Date   WBC 5.6 12/30/2022   HGB 14.7 12/30/2022   HCT 44.5 12/30/2022   MCV 78 (L) 12/30/2022   PLT 331 12/30/2022    Attestation Statements:   Reviewed by clinician on day of visit: allergies, medications, problem list, medical history, surgical history, family history, social history, and previous encounter notes.  I, Kathlene November, BS, CMA, am acting as  transcriptionist for Loyal Gambler, DO.   I have reviewed the above documentation for accuracy and completeness, and I agree with the above. - ***

## 2023-02-02 ENCOUNTER — Telehealth (INDEPENDENT_AMBULATORY_CARE_PROVIDER_SITE_OTHER): Payer: Self-pay | Admitting: Family Medicine

## 2023-02-02 NOTE — Telephone Encounter (Signed)
Patient called wanting to talk to Dr.Bowen about his glucose levels and is wanting a medication sent to his pharmacy to maintain his glucose levels. I did inform the patient he will be able to speak with Dr.Bowen at his visit next Tuesday. Message is being sent to be aware that he called about this situation. Thanks

## 2023-02-05 ENCOUNTER — Other Ambulatory Visit (INDEPENDENT_AMBULATORY_CARE_PROVIDER_SITE_OTHER): Payer: Self-pay | Admitting: Family Medicine

## 2023-02-08 ENCOUNTER — Encounter (INDEPENDENT_AMBULATORY_CARE_PROVIDER_SITE_OTHER): Payer: Self-pay | Admitting: Family Medicine

## 2023-02-08 ENCOUNTER — Ambulatory Visit (INDEPENDENT_AMBULATORY_CARE_PROVIDER_SITE_OTHER): Payer: BC Managed Care – PPO | Admitting: Family Medicine

## 2023-02-08 VITALS — BP 121/75 | HR 63 | Temp 98.2°F | Ht 72.0 in | Wt 247.0 lb

## 2023-02-08 DIAGNOSIS — R7303 Prediabetes: Secondary | ICD-10-CM

## 2023-02-08 DIAGNOSIS — E559 Vitamin D deficiency, unspecified: Secondary | ICD-10-CM

## 2023-02-08 DIAGNOSIS — Z6833 Body mass index (BMI) 33.0-33.9, adult: Secondary | ICD-10-CM | POA: Diagnosis not present

## 2023-02-08 DIAGNOSIS — E669 Obesity, unspecified: Secondary | ICD-10-CM | POA: Diagnosis not present

## 2023-02-08 DIAGNOSIS — E88819 Insulin resistance, unspecified: Secondary | ICD-10-CM

## 2023-02-08 MED ORDER — RYBELSUS 3 MG PO TABS: 3.0000 mg | ORAL_TABLET | Freq: Every day | ORAL | 0 refills | Status: DC

## 2023-02-08 MED ORDER — VITAMIN D (ERGOCALCIFEROL) 1.25 MG (50000 UNIT) PO CAPS: 50000.0000 [IU] | ORAL_CAPSULE | ORAL | 0 refills | Status: DC

## 2023-02-08 NOTE — Assessment & Plan Note (Signed)
Last fasting insulin 17.8 12/30/2022 He has been working on [employer], reducing added sugar and regular exercise He has previously declined use of metformin  We discussed reading labels on food and drink for sugar, avoiding products with over 8 g of sugar per serving  Begin Rybelsus 3 mg tab 30 minutes before first meal of the day.  He denies a personal or family history for pancreatitis, multiple endocrine neoplasia or medullary thyroid carcinoma.  We reviewed mechanism of action and potential adverse side effects.

## 2023-02-08 NOTE — Assessment & Plan Note (Signed)
Last vitamin D Lab Results  Component Value Date   VD25OH 30.4 12/30/2022   He is doing well on prescription vitamin D 50,000 IU once weekly.  Refilled today.  Energy level is improving.  Recheck level in 2 to 3 months

## 2023-02-08 NOTE — Progress Notes (Signed)
Office: [phone]  /  Fax: [phone]  WEIGHT SUMMARY AND BIOMETRICS  Vitals Temp: 98.2 F (36.8 C) BP: 121/75 Pulse Rate: 63 SpO2: 97 %   Anthropometric Measurements Height: 6' (1.829 m) Weight: 247 lb (112 kg) BMI (Calculated): 33.49 Weight at Last Visit: 253lb Weight Lost Since Last Visit: 6lb Starting Weight: 254lb Total Weight Loss (lbs): 7 lb (3.175 kg) Waist Measurement : 44 inches   Body Composition  Body Fat %: 29.6 % Fat Mass (lbs): 73.2 lbs Muscle Mass (lbs): 165.8 lbs Total Body Water (lbs): 49 lbs Visceral Fat Rating : 13   Other Clinical Data RMR: 2160 Fasting: no Labs: no Today's Visit #: 3 Starting Date: 12/30/22   HPI  Chief Complaint: OBESITY  Brady Wells is here to discuss his progress with his obesity treatment plan. He is on the keeping a food journal and adhering to recommended goals of 2200 calories and 140 protein and states he is following his eating plan approximately 90 % of the time. He states he is exercising 60 minutes 5 times per week.   Interval History:  Since last office visit he is down 6 lb He is doing cardio and weights at the gym 5 days/ wk He is losing inches He is getting ~ 7 hrs of sleep Allows pizza once a week He works night shift He has a newborn baby at home  Pharmacotherapy: none  PHYSICAL EXAM:  Blood pressure 121/75, pulse 63, temperature 98.2 F (36.8 C), height 6' (1.829 m), weight 247 lb (112 kg), SpO2 97 %. Body mass index is 33.5 kg/m.  General: He is overweight, cooperative, alert, well developed, and in no acute distress. PSYCH: Has normal mood, affect and thought process.   Lungs: Normal breathing effort, no conversational dyspnea.   ASSESSMENT AND PLAN  TREATMENT PLAN FOR OBESITY:  Recommended Dietary Goals  Brady Wells is currently in the action stage of change. As such, his goal is to continue weight management plan. He has agreed to keeping a food journal and adhering to recommended  goals of 2200 calories and 140 g of protein.  Behavioral Intervention  We discussed the following Behavioral Modification Strategies today: increasing lean protein intake, increasing vegetables, increasing water intake, work on meal planning and easy cooking plans, work on tracking and journaling calories using tracking App, and reading food labels .  Additional resources provided today: NA  Recommended Physical Activity Goals  Monique has been advised to work up to 150 minutes of moderate intensity aerobic activity a week and strengthening exercises 2-3 times per week for cardiovascular health, weight loss maintenance and preservation of muscle mass.   He has agreed to continue physical activity as is.   Pharmacotherapy changes for the treatment of obesity: Begin use of Rybelsus for insulin resistance  ASSOCIATED CONDITIONS ADDRESSED TODAY  Insulin resistance Assessment & Plan: Last fasting insulin 17.8 12/30/2022 He has been working on [employer], reducing added sugar and regular exercise He has previously declined use of metformin  We discussed reading labels on food and drink for sugar, avoiding products with over 8 g of sugar per serving  Begin Rybelsus 3 mg tab 30 minutes before first meal of the day.  He denies a personal or family history for pancreatitis, multiple endocrine neoplasia or medullary thyroid carcinoma.  We reviewed mechanism of action and potential adverse side effects.  Orders: -     Rybelsus; Take 1 tablet (3 mg total) by mouth daily.  Dispense: 30 tablet; Refill: 0  Generalized obesity  BMI 33.0-33.9,adult  Pre-diabetes Assessment & Plan: Lab Results  Component Value Date   HGBA1C 6.2 (H) 12/30/2022   Patient is actively working on a higher protein/lower carbohydrate diet, dietary logging and cardio and resistance training exercise 1+ hours 5 days a week.  He has concerns about developing type 2 diabetes with a positive family history.  He is  doing well with medical weight loss and we will recheck A1c in 2 to 3 months   Vitamin D deficiency Assessment & Plan: Last vitamin D Lab Results  Component Value Date   VD25OH 30.4 12/30/2022   He is doing well on prescription vitamin D 50,000 IU once weekly.  Refilled today.  Energy level is improving.  Recheck level in 2 to 3 months  Orders: -     Vitamin D (Ergocalciferol); Take 1 capsule (50,000 Units total) by mouth every 7 (seven) days.  Dispense: 5 capsule; Refill: 0      He was informed of the importance of frequent follow up visits to maximize his success with intensive lifestyle modifications for his multiple health conditions.   ATTESTASTION STATEMENTS:  Reviewed by clinician on day of visit: allergies, medications, problem list, medical history, surgical history, family history, social history, and previous encounter notes pertinent to obesity diagnosis.   I have personally spent 30 minutes total time today in preparation, patient care, nutritional counseling and documentation for this visit, including the following: review of clinical lab tests; review of medical tests/procedures/services.      Dell Ponto, DO DABFM, DABOM [facility] [address] [place], [place] [address] [phone]

## 2023-02-08 NOTE — Assessment & Plan Note (Signed)
Lab Results  Component Value Date   HGBA1C 6.2 (H) 12/30/2022   Patient is actively working on a higher protein/lower carbohydrate diet, dietary logging and cardio and resistance training exercise 1+ hours 5 days a week.  He has concerns about developing type 2 diabetes with a positive family history.  He is doing well with medical weight loss and we will recheck A1c in 2 to 3 months

## 2023-03-02 ENCOUNTER — Other Ambulatory Visit (INDEPENDENT_AMBULATORY_CARE_PROVIDER_SITE_OTHER): Payer: Self-pay | Admitting: Family Medicine

## 2023-03-02 DIAGNOSIS — E559 Vitamin D deficiency, unspecified: Secondary | ICD-10-CM

## 2023-03-10 ENCOUNTER — Encounter (INDEPENDENT_AMBULATORY_CARE_PROVIDER_SITE_OTHER): Payer: Self-pay | Admitting: Family Medicine

## 2023-03-10 ENCOUNTER — Ambulatory Visit (INDEPENDENT_AMBULATORY_CARE_PROVIDER_SITE_OTHER): Payer: BC Managed Care – PPO | Admitting: Family Medicine

## 2023-03-10 VITALS — BP 132/78 | HR 68 | Temp 98.1°F | Ht 72.0 in | Wt 245.0 lb

## 2023-03-10 DIAGNOSIS — E559 Vitamin D deficiency, unspecified: Secondary | ICD-10-CM | POA: Diagnosis not present

## 2023-03-10 DIAGNOSIS — G4726 Circadian rhythm sleep disorder, shift work type: Secondary | ICD-10-CM | POA: Diagnosis not present

## 2023-03-10 DIAGNOSIS — E669 Obesity, unspecified: Secondary | ICD-10-CM

## 2023-03-10 DIAGNOSIS — R7303 Prediabetes: Secondary | ICD-10-CM

## 2023-03-10 DIAGNOSIS — E88819 Insulin resistance, unspecified: Secondary | ICD-10-CM

## 2023-03-10 DIAGNOSIS — Z6833 Body mass index (BMI) 33.0-33.9, adult: Secondary | ICD-10-CM

## 2023-03-10 MED ORDER — VITAMIN D (ERGOCALCIFEROL) 1.25 MG (50000 UNIT) PO CAPS: 50000.0000 [IU] | ORAL_CAPSULE | ORAL | 0 refills | Status: DC

## 2023-03-10 MED ORDER — RYBELSUS 7 MG PO TABS: 7.0000 mg | ORAL_TABLET | Freq: Every day | ORAL | 0 refills | Status: DC

## 2023-03-10 NOTE — Assessment & Plan Note (Signed)
Last vitamin D Lab Results  Component Value Date   VD25OH 30.4 12/30/2022   He has been taking prescription vitamin D 50,000 IU once weekly.  His target vitamin D level is 50-70.  Energy level has been stable.  He denies adverse side effects.  Plan: Continue prescription vitamin D 50,000 IU once weekly, refilled today.  Recheck level in 2 months.

## 2023-03-10 NOTE — Progress Notes (Signed)
Office: [phone]  /  Fax: [phone]  WEIGHT SUMMARY AND BIOMETRICS  Starting Date: 12/30/22  Starting Weight: 254lb   Weight Lost Since Last Visit: 2lb   Vitals Temp: 98.1 F (36.7 C) BP: 132/78 Pulse Rate: 68 SpO2: 95 %   Body Composition  Body Fat %: 29.6 % Fat Mass (lbs): 72.6 lbs Muscle Mass (lbs): 164.4 lbs Total Body Water (lbs): 122.2 lbs Visceral Fat Rating : 14   HPI  Chief Complaint: OBESITY  Brady Wells is here to discuss his progress with his obesity treatment plan. He is on the keeping a food journal and adhering to recommended goals of 2200 calories and 140 protein and states he is following his eating plan approximately 80 % of the time. He states he is exercising 60 minutes 4-5 times per week.  Interval History:  Since last office visit he is down 2 lb He is doing weight training 4- 5 x a week He is doing some walking/ running/ stairmaster 3-4 x a week He is sleeping better- works nights Support at home is good   Pharmacotherapy: Rybelsus  PHYSICAL EXAM:  Blood pressure 132/78, pulse 68, temperature 98.1 F (36.7 C), height 6' (1.829 m), weight 245 lb (111.1 kg), SpO2 95 %. Body mass index is 33.23 kg/m.  General: He is overweight, cooperative, alert, well developed, and in no acute distress. PSYCH: Has normal mood, affect and thought process.   Lungs: Normal breathing effort, no conversational dyspnea.   ASSESSMENT AND PLAN  TREATMENT PLAN FOR OBESITY:  Recommended Dietary Goals  Brady Wells is currently in the action stage of change. As such, his goal is to continue weight management plan. He has agreed to keeping a food journal and adhering to recommended goals of 2200 calories and 140 g of  protein.  Behavioral Intervention  We discussed the following Behavioral Modification Strategies today: increasing vegetables, increasing lower glycemic fruits, increasing fiber rich foods, avoiding skipping meals, increasing water intake, work  on meal planning and preparation, work on tracking and journaling calories using tracking application, avoiding temptations and identifying enticing environmental cues, and planning for success.  Additional resources provided today: NA  Recommended Physical Activity Goals  Brady Wells has been advised to work up to 150 minutes of moderate intensity aerobic activity a week and strengthening exercises 2-3 times per week for cardiovascular health, weight loss maintenance and preservation of muscle mass.   He has agreed to Work on scheduling and tracking physical activity.   Pharmacotherapy changes for the treatment of obesity: Rybelsus increasing to 7 mg once daily  ASSOCIATED CONDITIONS ADDRESSED TODAY  Generalized obesity Assessment & Plan: We reviewed his overall progress of 9 pounds of weight loss in the past 2 months, maintaining most of his skeletal muscle mass.  He is doing great with weight training and appears to be hitting his protein goal of around 140 g/day.  He appears to be under eating not hitting his target for 2200 cal/day.  We reviewed ways to increase his caloric intake without increasing his carbs or sugar intake.  He was over consuming [employer].  Recommended intaking more healthy fats and dietary sources of protein instead of just protein shakes.   Insulin resistance Assessment & Plan: Fasting insulin was elevated 12/30/22 at 17.8 He declined use of metformin He has lost 9 lb in 2 mos He is tolerating Rybelsus 3 mg once daily injection without adverse side effect  Plan: Continue working on a low sugar/lower carbohydrate diet, regular exercise 5 days  a week.  Increase Rybelsus to 7 mg once daily.  Recheck fasting insulin and A1c in 2 months.  Orders: -     Rybelsus; Take 1 tablet (7 mg total) by mouth daily.  Dispense: 30 tablet; Refill: 0  Vitamin D deficiency Assessment & Plan: Last vitamin D Lab Results  Component Value Date   VD25OH 30.4 12/30/2022   He has  been taking prescription vitamin D 50,000 IU once weekly.  His target vitamin D level is 50-70.  Energy level has been stable.  He denies adverse side effects.  Plan: Continue prescription vitamin D 50,000 IU once weekly, refilled today.  Recheck level in 2 months.  Orders: -     Vitamin D (Ergocalciferol); Take 1 capsule (50,000 Units total) by mouth every 7 (seven) days.  Dispense: 5 capsule; Refill: 0  BMI 33.0-33.9,adult  Pre-diabetes Assessment & Plan: Lab Results  Component Value Date   HGBA1C 6.2 (H) 12/30/2022   He has declined use of metformin.  He is doing well on Rybelsus 3 mg once daily.  He is tracking his daily caloric intake and limiting intake of high sugar foods and drinks.  In 24-hour food recall, he was eating a large amount of Jack fruit daily.  We reviewed the carbohydrate and natural sugar content on the Jack fruit nutrition label.  He agrees to discontinuing intake of [employer] fruit and swapping it out for 1 apple while at work.  Continue regular exercise, dietary change and weight reduction plan.  Increasing Rybelsus to 7 mg once daily.  Recheck A1c and fasting insulin in 2 months.   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  He lacks much of an appetite due to sleep deprivation.  He is still getting in vigorous workouts 5 days a week.  Aim for 6 hours of sleep daily.  He may need less vigorous workouts to help stimulate his appetite.       He was informed of the importance of frequent follow up visits to maximize his success with intensive lifestyle modifications for his multiple health conditions.   ATTESTASTION STATEMENTS:  Reviewed by clinician on day of visit: allergies, medications, problem list, medical history, surgical history, family history, social history, and previous encounter notes pertinent to obesity diagnosis.   I have personally spent 30 minutes total time  today in preparation, patient care, nutritional counseling and documentation for this visit, including the following: review of clinical lab tests; review of medical tests/procedures/services.      Brady Brink, DO DABFM, DABOM [facility] [address] [place], [place] [address] [phone]

## 2023-03-10 NOTE — Assessment & Plan Note (Signed)
Fasting insulin was elevated 12/30/22 at 17.8 He declined use of metformin He has lost 9 lb in 2 mos He is tolerating Rybelsus 3 mg once daily injection without adverse side effect  Plan: Continue working on a low sugar/lower carbohydrate diet, regular exercise 5 days a week.  Increase Rybelsus to 7 mg once daily.  Recheck fasting insulin and A1c in 2 months.

## 2023-03-10 NOTE — Assessment & Plan Note (Signed)
Lack of sleep is still a factor with his energy level and overall weight loss.  He lacks much of an appetite due to sleep deprivation.  He is still getting in vigorous workouts 5 days a week.  Aim for 6 hours of sleep daily.  He may need less vigorous workouts to help stimulate his appetite.

## 2023-03-10 NOTE — Assessment & Plan Note (Signed)
We reviewed his overall progress of 9 pounds of weight loss in the past 2 months, maintaining most of his skeletal muscle mass.  He is doing great with weight training and appears to be hitting his protein goal of around 140 g/day.  He appears to be under eating not hitting his target for 2200 cal/day.  We reviewed ways to increase his caloric intake without increasing his carbs or sugar intake.  He was over consuming [employer].  Recommended intaking more healthy fats and dietary sources of protein instead of just protein shakes.

## 2023-03-10 NOTE — Assessment & Plan Note (Signed)
Lab Results  Component Value Date   HGBA1C 6.2 (H) 12/30/2022   He has declined use of metformin.  He is doing well on Rybelsus 3 mg once daily.  He is tracking his daily caloric intake and limiting intake of high sugar foods and drinks.  In 24-hour food recall, he was eating a large amount of Jack fruit daily.  We reviewed the carbohydrate and natural sugar content on the Jack fruit nutrition label.  He agrees to discontinuing intake of [employer] fruit and swapping it out for 1 apple while at work.  Continue regular exercise, dietary change and weight reduction plan.  Increasing Rybelsus to 7 mg once daily.  Recheck A1c and fasting insulin in 2 months.

## 2023-04-05 ENCOUNTER — Encounter (INDEPENDENT_AMBULATORY_CARE_PROVIDER_SITE_OTHER): Payer: Self-pay | Admitting: Family Medicine

## 2023-04-05 ENCOUNTER — Ambulatory Visit (INDEPENDENT_AMBULATORY_CARE_PROVIDER_SITE_OTHER): Payer: BC Managed Care – PPO | Admitting: Family Medicine

## 2023-04-05 VITALS — BP 125/77 | HR 61 | Temp 98.7°F | Ht 72.0 in | Wt 245.0 lb

## 2023-04-05 DIAGNOSIS — E559 Vitamin D deficiency, unspecified: Secondary | ICD-10-CM

## 2023-04-05 DIAGNOSIS — E669 Obesity, unspecified: Secondary | ICD-10-CM | POA: Diagnosis not present

## 2023-04-05 DIAGNOSIS — E88819 Insulin resistance, unspecified: Secondary | ICD-10-CM

## 2023-04-05 DIAGNOSIS — Z6833 Body mass index (BMI) 33.0-33.9, adult: Secondary | ICD-10-CM | POA: Diagnosis not present

## 2023-04-05 DIAGNOSIS — R7303 Prediabetes: Secondary | ICD-10-CM | POA: Diagnosis not present

## 2023-04-05 MED ORDER — LIRAGLUTIDE 18 MG/3ML ~~LOC~~ SOPN
0.6000 mg | PEN_INJECTOR | Freq: Every day | SUBCUTANEOUS | 0 refills | Status: DC
Start: 2023-04-05 — End: 2023-05-23

## 2023-04-05 MED ORDER — INSULIN PEN NEEDLE 32G X 4 MM MISC
0 refills | Status: DC
Start: 2023-04-05 — End: 2023-05-23

## 2023-04-05 MED ORDER — VITAMIN D (ERGOCALCIFEROL) 1.25 MG (50000 UNIT) PO CAPS
50000.0000 [IU] | ORAL_CAPSULE | ORAL | 0 refills | Status: DC
Start: 2023-04-05 — End: 2023-05-23

## 2023-04-05 NOTE — Assessment & Plan Note (Signed)
Lab Results  Component Value Date   HGBA1C 6.2 (H) 12/30/2022   He has declined use of metformin and is actively working on reducing his intake of sugar and refined carbohydrates.  He has seen a total body weight loss of 9 pounds in 3 months.  He has been consistent with regular exercise including weight training several days a week.  He has failed to see much benefit from Rybelsus even at 7 mg once daily. Continue to work on a low sugar/low starch diet, rich in lean protein and fiber, weight reduction and regular exercise.

## 2023-04-05 NOTE — Assessment & Plan Note (Signed)
Last vitamin D Lab Results  Component Value Date   VD25OH 30.4 12/30/2022   He is doing well on prescription vitamin D 50,000 IU once weekly without adverse side effect Recheck vitamin D level next visit

## 2023-04-05 NOTE — Assessment & Plan Note (Signed)
His last fasting insulin was elevated at 17.8 in February.  He has declined use of metformin and is working on dietary change.  He does work night shift which may be worsening his insulin resistance given his overall lack of sleep.  He failed to see much improvement in weight loss or reduction of food cravings on Rybelsus.  Will discontinue Rybelsus.  Begin Victoza 0.6 mg once daily injection.  Reviewed use of Victoza.  He has previously denied a personal or family history for pancreatitis, medullary thyroid carcinoma or multiple endocrine neoplasia type II.  Victoza will be used for treatment of insulin resistance, prediabetes and obesity.  Continue working on dietary change and regular exercise

## 2023-04-05 NOTE — Progress Notes (Signed)
Office: [phone]  /  Fax: [phone]  WEIGHT SUMMARY AND BIOMETRICS  Starting Date: 12/30/22  Starting Weight: 254lb   Weight Lost Since Last Visit: 0   Vitals Temp: 98.7 F (37.1 C) BP: 125/77 Pulse Rate: 61 SpO2: 97 %   Body Composition  Body Fat %: 30.3 % Fat Mass (lbs): 74.2 lbs Muscle Mass (lbs): 162.4 lbs Total Body Water (lbs): 120.4 lbs Visceral Fat Rating : 14   HPI  Chief Complaint: OBESITY  Brady Wells is here to discuss his progress with his obesity treatment plan. He is on the keeping a food journal and adhering to recommended goals of 2200 calories and 140 protein and states he is following his eating plan approximately 85 % of the time. He states he is exercising 60 minutes 4-5 times per week.   Interval History:  Since last office visit he is down 0 lb He did increase Rybelsus to 7 mg  He denies feeling any satiety or side effect He cut back on Jackfruit Some days, he does better with getting in protein He is consistently working out 60 minutes 5 days a week including cardio and weight training He is still working night shift and lacks adequate sleep  Pharmacotherapy: none  PHYSICAL EXAM:  Blood pressure 125/77, pulse 61, temperature 98.7 F (37.1 C), height 6' (1.829 m), weight 245 lb (111.1 kg), SpO2 97 %. Body mass index is 33.23 kg/m.  General: He is athletic appearing, cooperative, alert, well developed, and in no acute distress. PSYCH: Has normal mood, affect and thought process.   Lungs: Normal breathing effort, no conversational dyspnea.   ASSESSMENT AND PLAN  TREATMENT PLAN FOR OBESITY:  Recommended Dietary Goals  Anh is currently in the action stage of change. As such, his goal is to continue weight management plan. He has agreed to keeping a food journal and adhering to recommended goals of 2200 calories and 140 g  protein.  Behavioral Intervention  We discussed the following Behavioral Modification Strategies  today: increasing lean protein intake, decreasing simple carbohydrates , increasing vegetables, increasing lower glycemic fruits, increasing water intake, work on meal planning and preparation, work on managing stress, creating time for self-care and relaxation measures, continue to practice mindfulness when eating, and planning for success.  Additional resources provided today: NA  Recommended Physical Activity Goals  Ascencion has been advised to work up to 150 minutes of moderate intensity aerobic activity a week and strengthening exercises 2-3 times per week for cardiovascular health, weight loss maintenance and preservation of muscle mass.   He has agreed to Continue current level of physical activity   Pharmacotherapy changes for the treatment of obesity: Begin Victoza 0.6 mg once daily injection  ASSOCIATED CONDITIONS ADDRESSED TODAY  Pre-diabetes Assessment & Plan: Lab Results  Component Value Date   HGBA1C 6.2 (H) 12/30/2022   He has declined use of metformin and is actively working on reducing his intake of sugar and refined carbohydrates.  He has seen a total body weight loss of 9 pounds in 3 months.  He has been consistent with regular exercise including weight training several days a week.  He has failed to see much benefit from Rybelsus even at 7 mg once daily. Continue to work on a low sugar/low starch diet, rich in lean protein and fiber, weight reduction and regular exercise.  Orders: -     Liraglutide; Inject 0.6 mg into the skin daily.  Dispense: 9 mL; Refill: 0  Insulin resistance Assessment & Plan:  His last fasting insulin was elevated at 17.8 in February.  He has declined use of metformin and is working on dietary change.  He does work night shift which may be worsening his insulin resistance given his overall lack of sleep.  He failed to see much improvement in weight loss or reduction of food cravings on Rybelsus.  Will discontinue Rybelsus.  Begin Victoza 0.6 mg  once daily injection.  Reviewed use of Victoza.  He has previously denied a personal or family history for pancreatitis, medullary thyroid carcinoma or multiple endocrine neoplasia type II.  Victoza will be used for treatment of insulin resistance, prediabetes and obesity.  Continue working on dietary change and regular exercise  Orders: -     Liraglutide; Inject 0.6 mg into the skin daily.  Dispense: 9 mL; Refill: 0 -     Insulin Pen Needle; Use once daily as directed  Dispense: 90 each; Refill: 0  Vitamin D deficiency Assessment & Plan: Last vitamin D Lab Results  Component Value Date   VD25OH 30.4 12/30/2022   He is doing well on prescription vitamin D 50,000 IU once weekly without adverse side effect Recheck vitamin D level next visit  Orders: -     Vitamin D (Ergocalciferol); Take 1 capsule (50,000 Units total) by mouth every 7 (seven) days.  Dispense: 5 capsule; Refill: 0  BMI 33.0-33.9,adult  Generalized obesity with starting BMI 34      He was informed of the importance of frequent follow up visits to maximize his success with intensive lifestyle modifications for his multiple health conditions.   ATTESTASTION STATEMENTS:  Reviewed by clinician on day of visit: allergies, medications, problem list, medical history, surgical history, family history, social history, and previous encounter notes pertinent to obesity diagnosis.   I have personally spent 30 minutes total time today in preparation, patient care, nutritional counseling and documentation for this visit, including the following: review of clinical lab tests; review of medical tests/procedures/services.      Glennis Brink, DO DABFM, DABOM [facility] [address] [place], [place] [address] [phone]

## 2023-04-06 ENCOUNTER — Other Ambulatory Visit: Payer: Self-pay | Admitting: Internal Medicine

## 2023-04-08 ENCOUNTER — Other Ambulatory Visit (INDEPENDENT_AMBULATORY_CARE_PROVIDER_SITE_OTHER): Payer: Self-pay | Admitting: Family Medicine

## 2023-04-08 DIAGNOSIS — E88819 Insulin resistance, unspecified: Secondary | ICD-10-CM

## 2023-04-11 ENCOUNTER — Other Ambulatory Visit: Payer: Self-pay | Admitting: Internal Medicine

## 2023-04-26 ENCOUNTER — Other Ambulatory Visit (INDEPENDENT_AMBULATORY_CARE_PROVIDER_SITE_OTHER): Payer: Self-pay | Admitting: Family Medicine

## 2023-04-26 DIAGNOSIS — E559 Vitamin D deficiency, unspecified: Secondary | ICD-10-CM

## 2023-04-28 ENCOUNTER — Ambulatory Visit (INDEPENDENT_AMBULATORY_CARE_PROVIDER_SITE_OTHER): Payer: BC Managed Care – PPO | Admitting: Family Medicine

## 2023-05-23 ENCOUNTER — Other Ambulatory Visit (INDEPENDENT_AMBULATORY_CARE_PROVIDER_SITE_OTHER): Payer: Self-pay | Admitting: Family Medicine

## 2023-05-23 ENCOUNTER — Ambulatory Visit (INDEPENDENT_AMBULATORY_CARE_PROVIDER_SITE_OTHER): Payer: BC Managed Care – PPO | Admitting: Family Medicine

## 2023-05-23 ENCOUNTER — Encounter (INDEPENDENT_AMBULATORY_CARE_PROVIDER_SITE_OTHER): Payer: Self-pay | Admitting: Family Medicine

## 2023-05-23 ENCOUNTER — Telehealth (INDEPENDENT_AMBULATORY_CARE_PROVIDER_SITE_OTHER): Payer: Self-pay | Admitting: Family Medicine

## 2023-05-23 VITALS — BP 129/77 | HR 67 | Temp 98.1°F | Ht 72.0 in | Wt 250.0 lb

## 2023-05-23 DIAGNOSIS — E88819 Insulin resistance, unspecified: Secondary | ICD-10-CM

## 2023-05-23 DIAGNOSIS — E669 Obesity, unspecified: Secondary | ICD-10-CM

## 2023-05-23 DIAGNOSIS — Z6833 Body mass index (BMI) 33.0-33.9, adult: Secondary | ICD-10-CM

## 2023-05-23 DIAGNOSIS — E559 Vitamin D deficiency, unspecified: Secondary | ICD-10-CM | POA: Diagnosis not present

## 2023-05-23 DIAGNOSIS — R7303 Prediabetes: Secondary | ICD-10-CM

## 2023-05-23 MED ORDER — INSULIN PEN NEEDLE 32G X 4 MM MISC
0 refills | Status: DC
Start: 2023-05-23 — End: 2023-07-25

## 2023-05-23 MED ORDER — LIRAGLUTIDE 18 MG/3ML ~~LOC~~ SOPN: 1.8000 mg | PEN_INJECTOR | Freq: Every day | SUBCUTANEOUS | 0 refills | Status: DC

## 2023-05-23 MED ORDER — LIRAGLUTIDE 18 MG/3ML ~~LOC~~ SOPN
1.8000 mg | PEN_INJECTOR | Freq: Every day | SUBCUTANEOUS | 0 refills | Status: DC
Start: 2023-05-23 — End: 2023-06-27

## 2023-05-23 MED ORDER — VITAMIN D (ERGOCALCIFEROL) 1.25 MG (50000 UNIT) PO CAPS
50000.0000 [IU] | ORAL_CAPSULE | ORAL | 0 refills | Status: DC
Start: 2023-05-23 — End: 2023-06-27

## 2023-05-23 NOTE — Assessment & Plan Note (Signed)
Last vitamin D Lab Results  Component Value Date   VD25OH 30.4 12/30/2022   Doing well on prescription ergocalciferol 50,000 IU once weekly. Energy level has improved Denies adverse side effects  Recheck level today

## 2023-05-23 NOTE — Telephone Encounter (Signed)
Prior authorization done for patients Victoza via cover my meds. Waiting on determination.

## 2023-05-23 NOTE — Progress Notes (Signed)
Office: [phone]  /  Fax: [phone]  WEIGHT SUMMARY AND BIOMETRICS  Starting Date: 12/30/22  Starting Weight: 254lb   Weight Lost Since Last Visit: 0lb   Vitals Temp: 98.1 F (36.7 C) BP: 129/77 Pulse Rate: 67 SpO2: 98 %   Body Composition  Body Fat %: 30 % Fat Mass (lbs): 75.2 lbs Muscle Mass (lbs): 166.6 lbs Total Body Water (lbs): 120.8 lbs Visceral Fat Rating : 14     HPI  Chief Complaint: OBESITY  Brady Wells is here to discuss his progress with his obesity treatment plan. He is on the keeping a food journal and adhering to recommended goals of 2200 calories and 140 protein and states he is following his eating plan approximately 80 % of the time. He states he is exercising 60-120 minutes 3-4 times per week.   Interval History:  Since last office visit he is up 5 lb He gained 4.2 lb of muscle mass in the past month and gained 1 lb of body fat He changed Ryblesus to Victoza, at 1.2 mg daily injection He is is not skipping meals or having hypoglycemia He did change his work schedule and is adjusting He is getting 4-5 hrs of sleep at night He is making good food choices Denies GI upset from Victoza He is doing 3-4 miles on treadmill and weight training  3-4 x a week  Pharmacotherapy: Victoza 1.2 mg once daily for insulin resistance and prediabetes  PHYSICAL EXAM:  Blood pressure 129/77, pulse 67, temperature 98.1 F (36.7 C), height 6' (1.829 m), weight 250 lb (113.4 kg), SpO2 98 %. Body mass index is 33.91 kg/m.  General: He is overweight, cooperative, alert, well developed, and in no acute distress. PSYCH: Has normal mood, affect and thought process.   Lungs: Normal breathing effort, no conversational dyspnea.   ASSESSMENT AND PLAN  TREATMENT PLAN FOR OBESITY:  Recommended Dietary Goals  Brady Wells is currently in the action stage of change. As such, his goal is to continue weight management plan. He has agreed to keeping a food journal and  adhering to recommended goals of 2200 calories and 150 g of protein.  Behavioral Intervention  We discussed the following Behavioral Modification Strategies today: increasing lean protein intake, decreasing simple carbohydrates , increasing vegetables, increasing lower glycemic fruits, increasing water intake, keeping healthy foods at home, work on managing stress, creating time for self-care and relaxation measures, avoiding temptations and identifying enticing environmental cues, continue to practice mindfulness when eating, and planning for success.  Additional resources provided today: NA  Recommended Physical Activity Goals  Brady Wells has been advised to work up to 150 minutes of moderate intensity aerobic activity a week and strengthening exercises 2-3 times per week for cardiovascular health, weight loss maintenance and preservation of muscle mass.   He has agreed to Continue current level of physical activity   Pharmacotherapy changes for the treatment of obesity: Increase Victoza to 1.8 mg once daily injection  ASSOCIATED CONDITIONS ADDRESSED TODAY  Pre-diabetes Assessment & Plan: Lab Results  Component Value Date   HGBA1C 6.2 (H) 12/30/2022   He is tolerating the Victoza at 1.2 mg once daily injection for the treatment of prediabetes and insulin resistance without a diagnosis of type 2 diabetes.  He has noticed only slight improvements in satiety.  He denies cravings in sugar.  He is working on improving his dietary intake and active weight loss.  He is consistent with doing cardio 3-4 times a week.  Recheck A1c today.  Increase Victoza to 1.8 mg once daily injection. Continue prescribed diet  Orders: -     Hemoglobin A1c -     Comprehensive metabolic panel -     Liraglutide; Inject 1.8 mg into the skin daily.  Dispense: 27 mL; Refill: 0  Insulin resistance Assessment & Plan: Last fasting insulin elevated at 17.8.  He has declined use of metformin but is working on  medically supervised weight loss, down 4 pounds in the past 4 months of medically supervised weight loss.  He is consistent doing cardio and resistance training exercise 3-4 times a week.  His sleep is lacking.  Continue working on a low sugar/low starch diet.  Continue current levels of physical activity.  Recheck fasting insulin today.  Consider use of metformin.  Orders: -     Insulin, random -     Liraglutide; Inject 1.8 mg into the skin daily.  Dispense: 27 mL; Refill: 0  Vitamin D deficiency Assessment & Plan: Last vitamin D Lab Results  Component Value Date   VD25OH 30.4 12/30/2022   Doing well on prescription ergocalciferol 50,000 IU once weekly. Energy level has improved Denies adverse side effects  Recheck level today  Orders: -     Vitamin D (Ergocalciferol); Take 1 capsule (50,000 Units total) by mouth every 7 (seven) days.  Dispense: 5 capsule; Refill: 0 -     VITAMIN D 25 Hydroxy (Vit-D Deficiency, Fractures)  Generalized obesity with starting BMI 34  BMI 33.0-33.9,adult      He was informed of the importance of frequent follow up visits to maximize his success with intensive lifestyle modifications for his multiple health conditions.   ATTESTASTION STATEMENTS:  Reviewed by clinician on day of visit: allergies, medications, problem list, medical history, surgical history, family history, social history, and previous encounter notes pertinent to obesity diagnosis.   I have personally spent 30 minutes total time today in preparation, patient care, nutritional counseling and documentation for this visit, including the following: review of clinical lab tests; review of medical tests/procedures/services.      Glennis Brink, DO DABFM, DABOM [facility] [address] [place], [place] [address] [phone]

## 2023-05-23 NOTE — Addendum Note (Signed)
Addended by: Glennis Brink on: 05/23/2023 01:36 PM   Modules accepted: Orders

## 2023-05-23 NOTE — Assessment & Plan Note (Signed)
Last fasting insulin elevated at 17.8.  He has declined use of metformin but is working on medically supervised weight loss, down 4 pounds in the past 4 months of medically supervised weight loss.  He is consistent doing cardio and resistance training exercise 3-4 times a week.  His sleep is lacking.  Continue working on a low sugar/low starch diet.  Continue current levels of physical activity.  Recheck fasting insulin today.  Consider use of metformin.

## 2023-05-23 NOTE — Assessment & Plan Note (Signed)
Lab Results  Component Value Date   HGBA1C 6.2 (H) 12/30/2022   He is tolerating the Victoza at 1.2 mg once daily injection for the treatment of prediabetes and insulin resistance without a diagnosis of type 2 diabetes.  He has noticed only slight improvements in satiety.  He denies cravings in sugar.  He is working on improving his dietary intake and active weight loss.  He is consistent with doing cardio 3-4 times a week.  Recheck A1c today.  Increase Victoza to 1.8 mg once daily injection. Continue prescribed diet

## 2023-05-24 LAB — COMPREHENSIVE METABOLIC PANEL
ALT: 61 IU/L — ABNORMAL HIGH (ref 0–44)
AST: 43 IU/L — ABNORMAL HIGH (ref 0–40)
Albumin: 4.4 g/dL (ref 4.1–5.1)
Alkaline Phosphatase: 60 IU/L (ref 44–121)
BUN/Creatinine Ratio: 13 (ref 9–20)
BUN: 16 mg/dL (ref 6–24)
Bilirubin Total: 0.6 mg/dL (ref 0.0–1.2)
CO2: 21 mmol/L (ref 20–29)
Calcium: 9.5 mg/dL (ref 8.7–10.2)
Chloride: 101 mmol/L (ref 96–106)
Creatinine, Ser: 1.24 mg/dL (ref 0.76–1.27)
Globulin, Total: 2.8 g/dL (ref 1.5–4.5)
Glucose: 96 mg/dL (ref 70–99)
Potassium: 4.2 mmol/L (ref 3.5–5.2)
Sodium: 140 mmol/L (ref 134–144)
Total Protein: 7.2 g/dL (ref 6.0–8.5)
eGFR: 75 mL/min/{1.73_m2} (ref >59)

## 2023-05-24 LAB — INSULIN, RANDOM: INSULIN: 28.8 u[IU]/mL — ABNORMAL HIGH (ref 2.6–24.9)

## 2023-05-24 LAB — HEMOGLOBIN A1C
Est. average glucose Bld gHb Est-mCnc: 137 mg/dL
Hgb A1c MFr Bld: 6.4 % — ABNORMAL HIGH (ref 4.8–5.6)

## 2023-05-24 LAB — VITAMIN D 25 HYDROXY (VIT D DEFICIENCY, FRACTURES): Vit D, 25-Hydroxy: 46 ng/mL (ref 30.0–100.0)

## 2023-05-24 NOTE — Telephone Encounter (Signed)
Prior authorization denied for patients Victoza.

## 2023-06-12 ENCOUNTER — Other Ambulatory Visit (INDEPENDENT_AMBULATORY_CARE_PROVIDER_SITE_OTHER): Payer: Self-pay | Admitting: Family Medicine

## 2023-06-12 DIAGNOSIS — E559 Vitamin D deficiency, unspecified: Secondary | ICD-10-CM

## 2023-06-27 ENCOUNTER — Other Ambulatory Visit (HOSPITAL_COMMUNITY): Payer: Self-pay

## 2023-06-27 ENCOUNTER — Ambulatory Visit (INDEPENDENT_AMBULATORY_CARE_PROVIDER_SITE_OTHER): Payer: BC Managed Care – PPO | Admitting: Family Medicine

## 2023-06-27 ENCOUNTER — Telehealth (INDEPENDENT_AMBULATORY_CARE_PROVIDER_SITE_OTHER): Payer: Self-pay | Admitting: Family Medicine

## 2023-06-27 ENCOUNTER — Encounter (INDEPENDENT_AMBULATORY_CARE_PROVIDER_SITE_OTHER): Payer: Self-pay | Admitting: Family Medicine

## 2023-06-27 VITALS — BP 133/80 | HR 85 | Temp 98.5°F | Ht 72.0 in | Wt 258.0 lb

## 2023-06-27 DIAGNOSIS — R748 Abnormal levels of other serum enzymes: Secondary | ICD-10-CM | POA: Diagnosis not present

## 2023-06-27 DIAGNOSIS — R7303 Prediabetes: Secondary | ICD-10-CM

## 2023-06-27 DIAGNOSIS — R632 Polyphagia: Secondary | ICD-10-CM | POA: Diagnosis not present

## 2023-06-27 DIAGNOSIS — E559 Vitamin D deficiency, unspecified: Secondary | ICD-10-CM | POA: Diagnosis not present

## 2023-06-27 DIAGNOSIS — E88819 Insulin resistance, unspecified: Secondary | ICD-10-CM

## 2023-06-27 DIAGNOSIS — Z6834 Body mass index (BMI) 34.0-34.9, adult: Secondary | ICD-10-CM

## 2023-06-27 DIAGNOSIS — E669 Obesity, unspecified: Secondary | ICD-10-CM

## 2023-06-27 MED ORDER — ZEPBOUND 2.5 MG/0.5ML ~~LOC~~ SOAJ
2.5000 mg | SUBCUTANEOUS | 0 refills | Status: DC
Start: 2023-06-27 — End: 2023-07-25
  Filled 2023-06-27: qty 2, 28d supply, fill #0

## 2023-06-27 MED ORDER — VITAMIN D (ERGOCALCIFEROL) 1.25 MG (50000 UNIT) PO CAPS
50000.0000 [IU] | ORAL_CAPSULE | ORAL | 0 refills | Status: DC
Start: 2023-06-27 — End: 2023-07-25

## 2023-06-27 NOTE — Telephone Encounter (Signed)
Prior authorization done via cover my meds for patients Zepbound. Waiting on determination.   

## 2023-06-27 NOTE — Assessment & Plan Note (Signed)
Last vitamin D Lab Results  Component Value Date   VD25OH 46.0 05/23/2023   Reviewed labs from last visit.  His vitamin D level is improving from 30.4-46.0.  Energy level is also improving.  He denies adverse side effects taking vitamin D 50,000 IU once weekly.  We discussed the target level 50-70.  Will continue vitamin D 50,000 IU once weekly.  Recheck level in 3 to 4 months.

## 2023-06-27 NOTE — Assessment & Plan Note (Signed)
Reviewed lab from last visit.  Unfortunately, his fasting insulin did increase.  He has declined use of metformin and is currently off of Victoza as his insurance did not cover it for insulin resistance and prediabetes.  He was seeing adequate weight loss.  He is still actively working on a reduced calorie low sugar dietary plan and regular exercise.  His poor sleep at night may have also worsened insulin resistance.  Handout provided today on insulin resistance.  Will continue working on healthy lifestyle changes.

## 2023-06-27 NOTE — Assessment & Plan Note (Signed)
Reviewed labs from last visit.  His AST and ALT remain mildly elevated.  He has no previous imaging to confirm nonalcoholic fatty liver disease.  He denies binge drinking or frequent use of acetaminophen.  Obtain liver ultrasound to confirm diagnosis.

## 2023-06-27 NOTE — Assessment & Plan Note (Signed)
Lab Results  Component Value Date   HGBA1C 6.4 (H) 05/23/2023   Reviewed labs from last visit.  Unfortunately, his A1c continues to rise.  He no longer has insurance coverage for use of Victoza which was helping with weight reduction, insulin resistance and prediabetes.  He has declined use of metformin.  He is at risk for type 2 diabetes with a positive family history.  Look for improvements with use of Zepbound for obesity management in combination with a low sugar/low starch diet, regular exercise and weight reduction.

## 2023-06-27 NOTE — Progress Notes (Signed)
Office: [phone]  /  Fax: [phone]  WEIGHT SUMMARY AND BIOMETRICS  Starting Date: 12/30/22  Starting Weight: 254lb   Weight Lost Since Last Visit: 0lb   Vitals Temp: 98.5 F (36.9 C) BP: 133/80 Pulse Rate: 85 SpO2: 95 %   Body Composition  Body Fat %: 30.8 % Fat Mass (lbs): 79.6 lbs Muscle Mass (lbs): 170.2 lbs Total Body Water (lbs): 125.6 lbs Visceral Fat Rating : 15     HPI  Chief Complaint: OBESITY  Brady Wells is here to discuss his progress with his obesity treatment plan. He is on the keeping a food journal and adhering to recommended goals of 2200 calories and 150 protein and states he is following his eating plan approximately 85-90 % of the time. He states he is exercising 60-120 minutes 5 times per week.   Interval History:  Since last office visit he is up 8 lb He is still working out doing weights and cardio 5 x a week He did change his work schedule and is sleeping better He gained 3.6 lb of muscle mass and is up 4.4 lb of body fat He had to stop Victoza due to no longer covered by insurance  He has been off of Victoza for a month and appetite has increased He has a net weight gain of 4 pounds in the past 5 months of medically supervised weight management Rybelsus was denied by insurance for prediabetes.  He has declined use of metformin.  Pharmacotherapy: None  PHYSICAL EXAM:  Blood pressure 133/80, pulse 85, temperature 98.5 F (36.9 C), height 6' (1.829 m), weight 258 lb (117 kg), SpO2 95%. Body mass index is 34.99 kg/m.  General: He is overweight, cooperative, alert, well developed, and in no acute distress. PSYCH: Has normal mood, affect and thought process.   Lungs: Normal breathing effort, no conversational dyspnea.   ASSESSMENT AND PLAN  TREATMENT PLAN FOR OBESITY:  Recommended Dietary Goals  Va is currently in the action stage of change. As such, his goal is to continue weight management plan. He has agreed to keeping  a food journal and adhering to recommended goals of 2200 calories and 150 g of protein.  Behavioral Intervention  We discussed the following Behavioral Modification Strategies today: increasing lean protein intake, decreasing simple carbohydrates , increasing vegetables, increasing lower glycemic fruits, avoiding skipping meals, increasing water intake, work on meal planning and preparation, keeping healthy foods at home, continue to work on implementation of reduced calorie nutritional plan, continue to practice mindfulness when eating, and planning for success.  Additional resources provided today: NA  Recommended Physical Activity Goals  [name] has been advised to work up to 150 minutes of moderate intensity aerobic activity a week and strengthening exercises 2-3 times per week for cardiovascular health, weight loss maintenance and preservation of muscle mass.   He has agreed to Work on scheduling and tracking physical activity.   Pharmacotherapy changes for the treatment of obesity: Begin Zepbound 2.5 mg once weekly injection for class I obesity BMI 34  ASSOCIATED CONDITIONS ADDRESSED TODAY  Elevated liver enzymes Assessment & Plan: Reviewed labs from last visit.  His AST and ALT remain mildly elevated.  He has no previous imaging to confirm nonalcoholic fatty liver disease.  He denies binge drinking or frequent use of acetaminophen.  Obtain liver ultrasound to confirm diagnosis.  Orders: -     US ABDOMEN LIMITED RUQ (LIVER/GB); Future  Vitamin D deficiency Assessment & Plan: Last vitamin D Lab Results  Component  Value Date   VD25OH 46.0 05/23/2023   Reviewed labs from last visit.  His vitamin D level is improving from 30.4-46.0.  Energy level is also improving.  He denies adverse side effects taking vitamin D 50,000 IU once weekly.  We discussed the target level 50-70.  Will continue vitamin D 50,000 IU once weekly.  Recheck level in 3 to 4 months.  Orders: -     Vitamin D  (Ergocalciferol); Take 1 capsule (50,000 Units total) by mouth every 7 (seven) days.  Dispense: 5 capsule; Refill: 0  Polyphagia -     Zepbound; Inject 2.5 mg into the skin once a week.  Dispense: 2 mL; Refill: 0  Pre-diabetes Assessment & Plan: Lab Results  Component Value Date   HGBA1C 6.4 (H) 05/23/2023   Reviewed labs from last visit.  Unfortunately, his A1c continues to rise.  He no longer has insurance coverage for use of Victoza which was helping with weight reduction, insulin resistance and prediabetes.  He has declined use of metformin.  He is at risk for type 2 diabetes with a positive family history.  Look for improvements with use of Zepbound for obesity management in combination with a low sugar/low starch diet, regular exercise and weight reduction.   Insulin resistance Assessment & Plan: Reviewed lab from last visit.  Unfortunately, his fasting insulin did increase.  He has declined use of metformin and is currently off of Victoza as his insurance did not cover it for insulin resistance and prediabetes.  He was seeing adequate weight loss.  He is still actively working on a reduced calorie low sugar dietary plan and regular exercise.  His poor sleep at night may have also worsened insulin resistance.  Handout provided today on insulin resistance.  Will continue working on healthy lifestyle changes.   Generalized obesity with starting BMI 34  BMI 34.0-34.9,adult      He was informed of the importance of frequent follow up visits to maximize his success with intensive lifestyle modifications for his multiple health conditions.   ATTESTASTION STATEMENTS:  Reviewed by clinician on day of visit: allergies, medications, problem list, medical history, surgical history, family history, social history, and previous encounter notes pertinent to obesity diagnosis.   I have personally spent 30 minutes total time today in preparation, patient care, nutritional counseling and  documentation for this visit, including the following: review of clinical lab tests; review of medical tests/procedures/services.      Glennis Brink, DO DABFM, DABOM [facility] [address] [place], [place] [address] [phone]

## 2023-06-30 ENCOUNTER — Telehealth (INDEPENDENT_AMBULATORY_CARE_PROVIDER_SITE_OTHER): Payer: Self-pay | Admitting: Family Medicine

## 2023-06-30 NOTE — Telephone Encounter (Signed)
8/1 The patient is calling about the prescription Zepbound. The patient stated that his insurance will not cover the medication and wants to try another alternative, such as an appetite suppressant. Please Advise. JE

## 2023-07-04 DIAGNOSIS — L089 Local infection of the skin and subcutaneous tissue, unspecified: Secondary | ICD-10-CM | POA: Insufficient documentation

## 2023-07-04 DIAGNOSIS — L219 Seborrheic dermatitis, unspecified: Secondary | ICD-10-CM | POA: Insufficient documentation

## 2023-07-07 ENCOUNTER — Other Ambulatory Visit (HOSPITAL_COMMUNITY): Payer: Self-pay

## 2023-07-17 ENCOUNTER — Other Ambulatory Visit (INDEPENDENT_AMBULATORY_CARE_PROVIDER_SITE_OTHER): Payer: Self-pay | Admitting: Family Medicine

## 2023-07-17 DIAGNOSIS — E559 Vitamin D deficiency, unspecified: Secondary | ICD-10-CM

## 2023-07-25 ENCOUNTER — Ambulatory Visit: Payer: BC Managed Care – PPO | Admitting: Family Medicine

## 2023-07-25 ENCOUNTER — Encounter: Payer: Self-pay | Admitting: Family Medicine

## 2023-07-25 VITALS — BP 124/77 | HR 57 | Temp 98.6°F | Ht 72.0 in | Wt 249.0 lb

## 2023-07-25 DIAGNOSIS — E559 Vitamin D deficiency, unspecified: Secondary | ICD-10-CM

## 2023-07-25 DIAGNOSIS — R748 Abnormal levels of other serum enzymes: Secondary | ICD-10-CM

## 2023-07-25 DIAGNOSIS — Z6833 Body mass index (BMI) 33.0-33.9, adult: Secondary | ICD-10-CM

## 2023-07-25 DIAGNOSIS — E669 Obesity, unspecified: Secondary | ICD-10-CM | POA: Diagnosis not present

## 2023-07-25 DIAGNOSIS — R7303 Prediabetes: Secondary | ICD-10-CM | POA: Diagnosis not present

## 2023-07-25 MED ORDER — VITAMIN D (ERGOCALCIFEROL) 1.25 MG (50000 UNIT) PO CAPS
50000.0000 [IU] | ORAL_CAPSULE | ORAL | 0 refills | Status: DC
Start: 2023-07-25 — End: 2023-08-22

## 2023-07-25 NOTE — Progress Notes (Signed)
Office: [phone]  /  Fax: [phone]  WEIGHT SUMMARY AND BIOMETRICS  Starting Date: 12/30/22  Starting Weight: 254lb   Weight Lost Since Last Visit: 9lb   Vitals Temp: 98.6 F (37 C) BP: 124/77 Pulse Rate: (!) 57 SpO2: 100 %   Body Composition  Body Fat %: 31.1 % Fat Mass (lbs): 77.4 lbs Muscle Mass (lbs): 163.2 lbs Total Body Water (lbs): 126.4 lbs Visceral Fat Rating : 15    HPI  Chief Complaint: OBESITY  Brady Wells is here to discuss his progress with his obesity treatment plan. He is on the keeping a food journal and adhering to recommended goals of 2200 calories and 150 protein and states he is following his eating plan approximately 90 % of the time. He states he is exercising 60 minutes 4 times per week.  Interval History:  Since last office visit he is down 9 lb He has been walking more and not weight lifting as much He is having a higher protein breakfast and dinner at night He brings a  light lunch He is not craving snacks and has cut out the chips Zepbound was not covered by insurance He did go for dinner out to last night He has a good support system He is tracking his calories most of the time Denies hunger or cravings  He has a net weight loss of 5 pounds in the past 6 months  Pharmacotherapy: none  PHYSICAL EXAM:  Blood pressure 124/77, pulse (!) 57, temperature 98.6 F (37 C), height 6' (1.829 m), weight 249 lb (112.9 kg), SpO2 100%. Body mass index is 33.77 kg/m.  General: He is overweight, cooperative, alert, well developed, and in no acute distress. PSYCH: Has normal mood, affect and thought process.   Lungs: Normal breathing effort, no conversational dyspnea.   ASSESSMENT AND PLAN  TREATMENT PLAN FOR OBESITY:  Recommended Dietary Goals  Brady Wells is currently in the action stage of change. As such, his goal is to continue weight management plan. He has agreed to keeping a food journal and adhering to recommended goals of 2200  calories and 150 g of  protein.  Behavioral Intervention  We discussed the following Behavioral Modification Strategies today: increasing lean protein intake, decreasing simple carbohydrates , increasing vegetables, increasing lower glycemic fruits, increasing water intake, work on meal planning and preparation, work on Counselling psychologist calories using tracking application, keeping healthy foods at home, continue to practice mindfulness when eating, and planning for success.  Additional resources provided today: NA  Recommended Physical Activity Goals  Brady Wells has been advised to work up to 150 minutes of moderate intensity aerobic activity a week and strengthening exercises 2-3 times per week for cardiovascular health, weight loss maintenance and preservation of muscle mass.   He has agreed to [employer] strengthening exercises with a goal of 2-3 sessions a week  and Work on scheduling and tracking physical activity.   Pharmacotherapy changes for the treatment of obesity:  none  ASSOCIATED CONDITIONS ADDRESSED TODAY  Vitamin D deficiency Assessment & Plan: Last vitamin D Lab Results  Component Value Date   VD25OH 46.0 05/23/2023    Energy level has improved.  On vitamin D 50,000 IU once weekly.  Denies adverse side effects.  Continue vitamin D 50,000 IU once weekly.  Recheck level in 2 months  Orders: -     Vitamin D (Ergocalciferol); Take 1 capsule (50,000 Units total) by mouth every 7 (seven) days.  Dispense: 5 capsule; Refill: 0  Generalized obesity  with starting BMI 34  BMI 33.0-33.9,adult  Elevated liver enzymes Assessment & Plan: Patient has had mild elevation in liver enzymes.  Liver ultrasound was ordered last visit.  This has not yet been performed.  There is a high likelihood of nonalcoholic fatty liver disease with BMI over 30 and elevated visceral fat rating.  Follow-up results of liver ultrasound once performed.  Continue active plan for weight reduction and  limited alcohol intake.   Pre-diabetes Assessment & Plan: Lab Results  Component Value Date   HGBA1C 6.4 (H) 05/23/2023   He has done well reducing his intake of starches and sweets.  Hunger and cravings are under good control.  He has declined starting metformin.  He lacked insurance coverage for use of a GLP-1 receptor agonist.  Continue prescribed dietary changes, adding and walking with a target goal of 10,000 steps daily.       He was informed of the importance of frequent follow up visits to maximize his success with intensive lifestyle modifications for his multiple health conditions.   ATTESTASTION STATEMENTS:  Reviewed by clinician on day of visit: allergies, medications, problem list, medical history, surgical history, family history, social history, and previous encounter notes pertinent to obesity diagnosis.   I have personally spent 30 minutes total time today in preparation, patient care, nutritional counseling and documentation for this visit, including the following: review of clinical lab tests; review of medical tests/procedures/services.      Glennis Brink, DO DABFM, DABOM [facility] [address] [place], [place] [address] [phone]

## 2023-07-25 NOTE — Assessment & Plan Note (Signed)
Last vitamin D Lab Results  Component Value Date   VD25OH 46.0 05/23/2023    Energy level has improved.  On vitamin D 50,000 IU once weekly.  Denies adverse side effects.  Continue vitamin D 50,000 IU once weekly.  Recheck level in 2 months

## 2023-07-25 NOTE — Assessment & Plan Note (Signed)
Patient has had mild elevation in liver enzymes.  Liver ultrasound was ordered last visit.  This has not yet been performed.  There is a high likelihood of nonalcoholic fatty liver disease with BMI over 30 and elevated visceral fat rating.  Follow-up results of liver ultrasound once performed.  Continue active plan for weight reduction and limited alcohol intake.

## 2023-07-25 NOTE — Assessment & Plan Note (Signed)
Lab Results  Component Value Date   HGBA1C 6.4 (H) 05/23/2023   He has done well reducing his intake of starches and sweets.  Hunger and cravings are under good control.  He has declined starting metformin.  He lacked insurance coverage for use of a GLP-1 receptor agonist.  Continue prescribed dietary changes, adding and walking with a target goal of 10,000 steps daily.

## 2023-08-02 ENCOUNTER — Telehealth: Payer: Self-pay | Admitting: *Deleted

## 2023-08-02 NOTE — Telephone Encounter (Signed)
Phone call to [facility] to follow up on patients referral for his ultrasound. I Made appointment for this Friday 08/05/2023 at 8:00 am for [facility]. Message sent to patient.

## 2023-08-05 ENCOUNTER — Ambulatory Visit (HOSPITAL_BASED_OUTPATIENT_CLINIC_OR_DEPARTMENT_OTHER): Admission: RE | Admit: 2023-08-05 | Payer: BC Managed Care – PPO | Source: Ambulatory Visit

## 2023-08-21 ENCOUNTER — Other Ambulatory Visit: Payer: Self-pay | Admitting: Family Medicine

## 2023-08-21 DIAGNOSIS — E559 Vitamin D deficiency, unspecified: Secondary | ICD-10-CM

## 2023-08-22 ENCOUNTER — Encounter: Payer: Self-pay | Admitting: Family Medicine

## 2023-08-22 ENCOUNTER — Ambulatory Visit: Payer: BC Managed Care – PPO | Admitting: Family Medicine

## 2023-08-22 ENCOUNTER — Telehealth: Payer: Self-pay | Admitting: *Deleted

## 2023-08-22 VITALS — BP 120/81 | HR 73 | Temp 97.6°F | Ht 72.0 in | Wt 241.0 lb

## 2023-08-22 DIAGNOSIS — E559 Vitamin D deficiency, unspecified: Secondary | ICD-10-CM | POA: Diagnosis not present

## 2023-08-22 DIAGNOSIS — E669 Obesity, unspecified: Secondary | ICD-10-CM

## 2023-08-22 DIAGNOSIS — R748 Abnormal levels of other serum enzymes: Secondary | ICD-10-CM | POA: Diagnosis not present

## 2023-08-22 DIAGNOSIS — Z6832 Body mass index (BMI) 32.0-32.9, adult: Secondary | ICD-10-CM

## 2023-08-22 DIAGNOSIS — E88819 Insulin resistance, unspecified: Secondary | ICD-10-CM | POA: Diagnosis not present

## 2023-08-22 MED ORDER — VITAMIN D (ERGOCALCIFEROL) 1.25 MG (50000 UNIT) PO CAPS
50000.0000 [IU] | ORAL_CAPSULE | ORAL | 0 refills | Status: DC
Start: 2023-08-22 — End: 2023-09-26

## 2023-08-22 NOTE — Telephone Encounter (Signed)
Phone call to centralized scheduling to re schedule patients abdominal ultrasound. His next appointment is 08/30/2023 at 10 AM with arrival at 9:45. This is at the [facility].

## 2023-08-22 NOTE — Assessment & Plan Note (Signed)
Last vitamin D Lab Results  Component Value Date   VD25OH 46.0 05/23/2023    He has been taking RX vitamin D weekly Energy level is improving  Repeat lab next visit

## 2023-08-22 NOTE — Assessment & Plan Note (Signed)
He has declined use of metformin and lacks insurance coverage for GLP-1 receptor agonist.  He remains in the prediabetic range with insulin resistance.  He has been actively working on increasing his regular exercise, improving sleep at night, reducing intake of starches and sweets.  Repeat fasting insulin and A1c next visit. Consider generic use of Victoza if needed.

## 2023-08-22 NOTE — Progress Notes (Signed)
Office: [phone]  /  Fax: [phone]  WEIGHT SUMMARY AND BIOMETRICS  Starting Date: 12/30/22  Starting Weight: 254lb   Weight Lost Since Last Visit: 8lb   Vitals Temp: 97.6 F (36.4 C) BP: 120/81 Pulse Rate: 73 SpO2: 99 %   Body Composition  Body Fat %: 29.6 % Fat Mass (lbs): 71.4 lbs Muscle Mass (lbs): 161.2 lbs Total Body Water (lbs): 118.6 lbs Visceral Fat Rating : 14   HPI  Chief Complaint: OBESITY  Brady Wells is here to discuss his progress with his obesity treatment plan. He is on the keeping a food journal and adhering to recommended goals of 2200 calories and 150 protein and states he is following his eating plan approximately 90 % of the time. He states he is exercising 60-120 minutes 4-5 times per week.   Interval History:  Since last office visit he is down 8 lb He is down 2 lb of muscle and down 6 lb of body fat in the past month He tends to get more off track on the weekends He has cut back on pizza and meals out He is working out 4-5 x a week He is working on improving sleep time He has a net weight loss of  13 lb in 7 mos  Pharmacotherapy: none  PHYSICAL EXAM:  Blood pressure 120/81, pulse 73, temperature 97.6 F (36.4 C), height 6' (1.829 m), weight 241 lb (109.3 kg), SpO2 99%. Body mass index is 32.69 kg/m.  General: He is overweight, cooperative, alert, well developed, and in no acute distress. PSYCH: Has normal mood, affect and thought process.   Lungs: Normal breathing effort, no conversational dyspnea.   ASSESSMENT AND PLAN  TREATMENT PLAN FOR OBESITY:  Recommended Dietary Goals  Brady Wells is currently in the action stage of change. As such, his goal is to continue weight management plan. He has agreed to keeping a food journal and adhering to recommended goals of 2200 calories and 150 g of  protein.  Behavioral Intervention  We discussed the following Behavioral Modification Strategies today: increasing lean protein intake,  decreasing simple carbohydrates , increasing vegetables, increasing lower glycemic fruits, increasing fiber rich foods, avoiding skipping meals, increasing water intake, work on meal planning and preparation, work on Counselling psychologist calories using tracking application, keeping healthy foods at home, continue to practice mindfulness when eating, and planning for success.  Additional resources provided today: NA  Recommended Physical Activity Goals  Brady Wells has been advised to work up to 150 minutes of moderate intensity aerobic activity a week and strengthening exercises 2-3 times per week for cardiovascular health, weight loss maintenance and preservation of muscle mass.   He has agreed to Increase the intensity, frequency or duration of strengthening exercises   Pharmacotherapy changes for the treatment of obesity: none  ASSOCIATED CONDITIONS ADDRESSED TODAY  Vitamin D deficiency Assessment & Plan: Last vitamin D Lab Results  Component Value Date   VD25OH 46.0 05/23/2023    He has been taking RX vitamin D weekly Energy level is improving  Repeat lab next visit  Orders: -     Vitamin D (Ergocalciferol); Take 1 capsule (50,000 Units total) by mouth every 7 (seven) days.  Dispense: 5 capsule; Refill: 0  Generalized obesity with starting BMI 34  BMI 32.0-32.9,adult  Elevated liver enzymes Assessment & Plan: Lab Results  Component Value Date   ALT 61 (H) 05/23/2023   AST 43 (H) 05/23/2023   ALKPHOS 60 05/23/2023   BILITOT 0.6 05/23/2023  He has cut back on alcohol intake.  A liver ultrasound was ordered but not yet performed.  Will follow-up the results of his liver ultrasound once completed.  Continue active plan for weight reduction with reduced intake of alcohol.   Insulin resistance Assessment & Plan: He has declined use of metformin and lacks insurance coverage for GLP-1 receptor agonist.  He remains in the prediabetic range with insulin resistance.  He has  been actively working on increasing his regular exercise, improving sleep at night, reducing intake of starches and sweets.  Repeat fasting insulin and A1c next visit. Consider generic use of Victoza if needed.       He was informed of the importance of frequent follow up visits to maximize his success with intensive lifestyle modifications for his multiple health conditions.   ATTESTASTION STATEMENTS:  Reviewed by clinician on day of visit: allergies, medications, problem list, medical history, surgical history, family history, social history, and previous encounter notes pertinent to obesity diagnosis.   I have personally spent 30 minutes total time today in preparation, patient care, nutritional counseling and documentation for this visit, including the following: review of clinical lab tests; review of medical tests/procedures/services.      Glennis Brink, DO DABFM, DABOM [facility] [address] [place], [place] [address] [phone]

## 2023-08-22 NOTE — Assessment & Plan Note (Signed)
Lab Results  Component Value Date   ALT 61 (H) 05/23/2023   AST 43 (H) 05/23/2023   ALKPHOS 60 05/23/2023   BILITOT 0.6 05/23/2023   He has cut back on alcohol intake.  A liver ultrasound was ordered but not yet performed.  Will follow-up the results of his liver ultrasound once completed.  Continue active plan for weight reduction with reduced intake of alcohol.

## 2023-08-25 ENCOUNTER — Encounter: Payer: BC Managed Care – PPO | Admitting: Internal Medicine

## 2023-08-27 ENCOUNTER — Ambulatory Visit (HOSPITAL_BASED_OUTPATIENT_CLINIC_OR_DEPARTMENT_OTHER)
Admission: RE | Admit: 2023-08-27 | Discharge: 2023-08-27 | Disposition: A | Discharge status: Home / Self Care | Payer: BC Managed Care – PPO | Source: Ambulatory Visit | Attending: Family Medicine | Admitting: Family Medicine

## 2023-08-27 DIAGNOSIS — R748 Abnormal levels of other serum enzymes: Secondary | ICD-10-CM | POA: Insufficient documentation

## 2023-08-30 ENCOUNTER — Ambulatory Visit (HOSPITAL_BASED_OUTPATIENT_CLINIC_OR_DEPARTMENT_OTHER): Payer: BC Managed Care – PPO

## 2023-09-11 ENCOUNTER — Ambulatory Visit
Admission: EM | Admit: 2023-09-11 | Discharge: 2023-09-11 | Disposition: A | Discharge status: Home / Self Care | Payer: BC Managed Care – PPO

## 2023-09-11 DIAGNOSIS — J019 Acute sinusitis, unspecified: Secondary | ICD-10-CM | POA: Diagnosis not present

## 2023-09-11 DIAGNOSIS — Z800 Family history of malignant neoplasm of digestive organs: Secondary | ICD-10-CM | POA: Insufficient documentation

## 2023-09-11 DIAGNOSIS — J45901 Unspecified asthma with (acute) exacerbation: Secondary | ICD-10-CM | POA: Diagnosis not present

## 2023-09-11 DIAGNOSIS — R634 Abnormal weight loss: Secondary | ICD-10-CM | POA: Insufficient documentation

## 2023-09-11 DIAGNOSIS — R140 Abdominal distension (gaseous): Secondary | ICD-10-CM | POA: Insufficient documentation

## 2023-09-11 MED ORDER — PREDNISONE 20 MG PO TABS
40.0000 mg | ORAL_TABLET | Freq: Every day | ORAL | 0 refills | Status: AC
Start: 2023-09-11 — End: 2023-09-16

## 2023-09-11 MED ORDER — AMOXICILLIN-POT CLAVULANATE 875-125 MG PO TABS: 1.0000 | ORAL_TABLET | Freq: Two times a day (BID) | ORAL | 0 refills | Status: DC

## 2023-09-11 NOTE — ED Provider Notes (Signed)
[facility]    CSN: [id number] Arrival date & time: 09/11/23  1117      History   Chief Complaint Chief Complaint  Patient presents with   Asthma Exacerbation    HPI Brady Wells is a 42 y.o. male.   Patient here today for evaluation of cough that he has had for the last 4 weeks.  He states that cough seems to be worse at nighttime.  He has not had any fever.  He denies any sore throat but has had significant congestion and sinus pressure.  He has been using his inhaler at times but reports symptoms have persisted.  The history is provided by the patient.    Past Medical History:  Diagnosis Date   Asthma    Depression     Patient Active Problem List   Diagnosis Date Noted   Abdominal bloating 09/11/2023   Abnormal weight loss 09/11/2023   Family history of malignant neoplasm of digestive organs 09/11/2023   Seborrheic dermatitis 07/04/2023   Skin inflammation 07/04/2023   Elevated liver enzymes 06/27/2023   Polyphagia 06/27/2023   Insulin resistance 01/13/2023   Vitamin D deficiency 01/13/2023   SOBOE (shortness of breath on exertion) 12/30/2022   1st degree AV block 12/30/2022   Generalized obesity with starting BMI 34 12/30/2022   Pre-diabetes 11/09/2022   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 Authorizing Provider  ADVAIR DISKUS 100-50 MCG/ACT AEPB INHALE 1 PUFF  INTO THE LUNGS TWICE A DAY Patient taking differently: Inhale 1 puff into the lungs 2 (two) times daily. Last used: 09-07-2023 04/11/23  Yes Dorothyann Peng, MD  albuterol (PROVENTIL) (2.5 MG/3ML) 0.083% nebulizer solution Take 5 mg by nebulization every 6 (six) hours as needed for wheezing or shortness of breath. Using with daughter (her medicine and nebulizer). Last used: 09-08-2023   Yes [provider]  albuterol (VENTOLIN HFA) 108 (90 Base) MCG/ACT inhaler INHALE 2 PUFFS INTO THE LUNGS EVERY 4 HOURS AS NEEDED FOR WHEEZING OR SHORTNESS OF BREATH. Patient taking differently: Inhale 2 puffs into the lungs every 4 (four) hours as needed for wheezing or shortness of breath. Last used: 09-08-2023, No aerochamber. 04/11/23  Yes Dorothyann Peng, MD  amoxicillin-clavulanate (AUGMENTIN) 875-125 MG tablet Take 1 tablet by mouth every 12 (twelve) hours. 09/11/23  Yes Tomi Bamberger, PA-C  BD PEN NEEDLE NANO 2ND GEN 32G X 4 MM MISC  08/21/23  Yes [provider]  finasteride (PROPECIA) 1 MG tablet Take 1 tablet by mouth daily. 07/04/23  Yes [provider]  Fluocinolone Acetonide Scalp 0.01 % OIL Apply 1 Application topically as directed. Topical 2-3 Times Weekly 07/04/23  Yes [provider]  ketoconazole (NIZORAL) 2 % shampoo Apply 1 Application topically 2 (two) times a week. 07/04/23  Yes [provider]  minoxidil (LONITEN) 2.5 MG tablet Take 2.5 mg by mouth daily. 07/04/23  Yes [provider]  Multiple Vitamin (MULTIVITAMIN ADULT PO) Take by mouth. 09/14/18  Yes [provider]  Multiple Vitamin (MULTIVITAMIN WITH MINERALS) TABS Take 1 tablet by mouth daily.   Yes [provider]  predniSONE (DELTASONE) 20 MG tablet Take 2 tablets (40 mg total) by mouth daily with breakfast for 5 days. 09/11/23 09/16/23 Yes Tomi Bamberger, PA-C  scopolamine (TRANSDERM SCOP, 1.5 MG,) 1 MG/3DAYS Place 1 patch onto the skin every 3 (three) days. 07/07/18  Yes  [provider]  benzonatate (TESSALON) 100 MG capsule Take 1 capsule (100 mg total) by mouth every 8 (eight) hours as needed for cough. 02/24/22   Dorothyann Peng, MD  cetirizine (ZYRTEC) 10 MG tablet Take 1 tablet (10 mg total) by mouth daily. 09/01/21   Gustavus Bryant, FNP  desonide (DESOWEN) 0.05 % lotion APPLY TOPICALLY 2 TIMES DAILY AS NEEDED. 08/31/22   Dorothyann Peng, MD  diazepam (VALIUM) 10 MG tablet Take 10 mg by mouth daily. 12/02/22   [provider]  EPINEPHRINE 0.3 mg/0.3 mL IJ SOAJ injection INJECT 0.3 MG INTO THE MUSCLE AS NEEDED. 04/06/23   Dorothyann Peng, MD  Fluocinolone Acetonide Scalp 0.01 % OIL Apply 1 Application topically as directed. Topical 2-3 Times Weekl    [provider]  fluticasone (FLONASE) 50 MCG/ACT nasal spray Place 1 spray into both nostrils daily for 3 days. 12/31/21 06/28/22  Gustavus Bryant, FNP  ibuprofen (ADVIL) 800 MG tablet Take 1 tablet (800 mg total) by mouth every 8 (eight) hours as needed. 02/20/21   Terrilee Files, MD  levocetirizine (XYZAL) 5 MG tablet TAKE 1 TABLET BY MOUTH EVERY DAY IN THE EVENING 08/18/22   Dorothyann Peng, MD  Vitamin D, Ergocalciferol, (DRISDOL) 1.25 MG (50000 UNIT) CAPS capsule Take 1 capsule (50,000 Units total) by mouth every 7 (seven) days. 08/22/23   Bowen, Scot Jun, DO    Family History Family History  Problem Relation Age of Onset   Healthy Mother    Healthy Father     Social History Social History   Tobacco Use   Smoking status: Never    Passive exposure: Never   Smokeless tobacco: Never  Vaping Use   Vaping status: Never Used  Substance Use Topics   Alcohol use: Yes    Comment: socially   Drug use: Never     Allergies   Bee venom   Review of Systems Review of Systems  Constitutional:  Negative for chills and fever.  HENT:  Positive for congestion and sinus pressure. Negative for ear pain and sore throat.   Eyes:  Negative for discharge and redness.  Respiratory:  Positive for  cough and wheezing. Negative for shortness of breath.   Gastrointestinal:  Negative for abdominal pain, nausea and vomiting.     Physical Exam Triage Vital Signs ED Triage Vitals  Encounter Vitals Group     BP 09/11/23 1132 104/68     Systolic BP Percentile --      Diastolic BP Percentile --      Pulse Rate 09/11/23 1132 60     Resp 09/11/23 1132 18     Temp 09/11/23 1132 97.6 F (36.4 C)     Temp Source 09/11/23 1132 Oral     SpO2 09/11/23 1132 96 %     Weight 09/11/23 1127 240 lb (108.9 kg)     Height 09/11/23 1127 6\' 1"  (1.854 m)     Head Circumference --      Peak Flow --  Pain Score 09/11/23 1126 0     Pain Loc --      Pain Education --      Exclude from Growth Chart --    No data found.  Updated Vital Signs BP 104/68 (BP Location: Left Arm)   Pulse 60   Temp 97.6 F (36.4 C) (Oral)   Resp 18   Ht 6\' 1"  (1.854 m)   Wt 240 lb (108.9 kg)   SpO2 96%   BMI 31.66 kg/m      Physical Exam Vitals and nursing note reviewed.  Constitutional:      General: He is not in acute distress.    Appearance: He is well-developed. He is not ill-appearing.  HENT:     Head: Normocephalic and atraumatic.     Nose: Congestion present.     Mouth/Throat:     Mouth: Mucous membranes are moist.     Pharynx: No oropharyngeal exudate or posterior oropharyngeal erythema.     Tonsils: 0 on the right. 0 on the left.  Eyes:     Conjunctiva/sclera: Conjunctivae normal.  Cardiovascular:     Rate and Rhythm: Normal rate and regular rhythm.     Heart sounds: Normal heart sounds. No murmur heard. Pulmonary:     Effort: Pulmonary effort is normal. No respiratory distress.     Breath sounds: Normal breath sounds. No wheezing, rhonchi or rales.  Skin:    General: Skin is warm and dry.  Neurological:     Mental Status: He is alert.  Psychiatric:        Mood and Affect: Mood normal.        Behavior: Behavior normal.      UC Treatments / Results  Labs (all labs ordered are  listed, but only abnormal results are displayed) Labs Reviewed - No data to display  EKG   Radiology No results found.  Procedures Procedures (including critical care time)  Medications Ordered in UC Medications - No data to display  Initial Impression / Assessment and Plan / UC Course  I have reviewed the triage vital signs and the nursing notes.  Pertinent labs & imaging results that were available during my care of the patient were reviewed by me and considered in my medical decision making (see chart for details).    Will treat to cover asthma exacerbation with steroid burst as well as Augmentin for sinusitis.  Recommended follow-up if no gradual improvement with any further concerns.  Final Clinical Impressions(s) / UC Diagnoses   Final diagnoses:  Asthma with acute exacerbation, unspecified asthma severity, unspecified whether persistent  Acute sinusitis, recurrence not specified, unspecified location   Discharge Instructions   None    ED Prescriptions     Medication Sig Dispense Auth. Provider   predniSONE (DELTASONE) 20 MG tablet Take 2 tablets (40 mg total) by mouth daily with breakfast for 5 days. 10 tablet Tomi Bamberger, PA-C   amoxicillin-clavulanate (AUGMENTIN) 875-125 MG tablet Take 1 tablet by mouth every 12 (twelve) hours. 14 tablet Tomi Bamberger, PA-C      PDMP not reviewed this encounter.   Tomi Bamberger, PA-C 09/11/23 1344

## 2023-09-11 NOTE — ED Triage Notes (Signed)
"  I have been having this constant cough and mucous for about 4 wks now". "My kids had it first and are better but I can't shake it". Still a lot of mucous/congestion and sinus pressure/pain. No fever.

## 2023-09-11 NOTE — ED Notes (Addendum)
Discussed with patient the last dos (date of service) re: Asthma and advisement from specialist as follows:  OverviewEdited:Wert, Charlaine Dalton, MD5/13/2020 5:05 PM Spirometry 05/06/09 FEV1 4.47 (109%) Ratio 0.90 off all rx with no curvature in effort indep portion of f/v loop  Last Assessment & Plan NoteWritten:Wert, Charlaine Dalton, MD5/13/2020 5:07 PM Spirometry 05/06/09 FEV1 4.47 (109%) Ratio 0.90 off all rx with no curvature in effort indep portion of f/v loop  Although I think it is possible that it one time in his life (remotely) he had enough rhinitis to cause secondary asthma, I see no evidence at all for active disease presently noting that he never used inhalers and yet can exercise in all conditions with no evidence of any asthma flares with extreme cold extreme heat or seasonal change or URIs.Marland Kitchen  He is therefore cleared for [employer] without restrictions and follow-up here is as needed.   Total time devoted to counseling > 50 % of initial 45 min office visit: review case with pt/ discussion of options/alternatives/ personally creating written customized instructions in presence of pt then going over those specific Instructions directly with the pt including how to use all of the meds but in particular covering each new medication in detail and the difference between the maintenance= "automatic" meds and the prns using an action plan format for the latter (If this problem/symptom => do that organization reading Left to right). Please see AVS from this visit for a full list of these instructions which I personally wrote for this pt and are unique to this visit.  Relevant Current Medications   ADVAIR DISKUS 100-50 MCG/ACT AEPB    INHALE 1 PUFF INTO THE LUNGS TWICE A DAY    albuterol (VENTOLIN HFA) 108 (90 Base) MCG/ACT inhaler    INHALE 2 PUFFS INTO THE LUNGS EVERY 4 HOURS AS NEEDED FOR WHEEZING OR SHORTNESS OF BREATH    albuterol (PROVENTIL) (2.5 MG/3ML) 0.083% nebulizer solution

## 2023-09-19 ENCOUNTER — Ambulatory Visit (INDEPENDENT_AMBULATORY_CARE_PROVIDER_SITE_OTHER): Payer: BC Managed Care – PPO | Admitting: Internal Medicine

## 2023-09-19 ENCOUNTER — Encounter: Payer: Self-pay | Admitting: Internal Medicine

## 2023-09-19 VITALS — BP 118/82 | HR 66 | Temp 98.1°F | Ht 73.0 in | Wt 245.2 lb

## 2023-09-19 DIAGNOSIS — M25511 Pain in right shoulder: Secondary | ICD-10-CM | POA: Diagnosis not present

## 2023-09-19 DIAGNOSIS — G8929 Other chronic pain: Secondary | ICD-10-CM | POA: Diagnosis not present

## 2023-09-19 DIAGNOSIS — K760 Fatty (change of) liver, not elsewhere classified: Secondary | ICD-10-CM

## 2023-09-19 DIAGNOSIS — M7672 Peroneal tendinitis, left leg: Secondary | ICD-10-CM | POA: Diagnosis not present

## 2023-09-19 DIAGNOSIS — M25512 Pain in left shoulder: Secondary | ICD-10-CM

## 2023-09-19 DIAGNOSIS — Z0000 Encounter for general adult medical examination without abnormal findings: Secondary | ICD-10-CM | POA: Insufficient documentation

## 2023-09-19 DIAGNOSIS — Z6832 Body mass index (BMI) 32.0-32.9, adult: Secondary | ICD-10-CM

## 2023-09-19 DIAGNOSIS — E6609 Other obesity due to excess calories: Secondary | ICD-10-CM

## 2023-09-19 DIAGNOSIS — E66811 Obesity, class 1: Secondary | ICD-10-CM | POA: Insufficient documentation

## 2023-09-19 NOTE — Assessment & Plan Note (Signed)
Most recent labs from [facility] and RUQ u/s results reviewed in [facility]. He is encouraged to decrease his sugar intake and intake of processed foods and meats. This may improve with weight loss. He may also benefit from dandelion root tea; however, he does have allergies. He could also try ginger/turmeric tea to decrease inflammation.

## 2023-09-19 NOTE — Progress Notes (Signed)
I,Victoria T Deloria Lair, CMA,acting as a Neurosurgeon for Gwynneth Aliment, MD.,have documented all relevant documentation on the behalf of Gwynneth Aliment, MD,as directed by  Gwynneth Aliment, MD while in the presence of Gwynneth Aliment, MD.  Subjective:   Patient ID: Brady Wells , male    DOB: Nov 26, 1981 , 42 y.o.   MRN: [id number]  Chief Complaint  Patient presents with   Annual Exam    HPI  Patient presents today for annual exam. He reports recently having to visit urgent care for further evaluation of severe allergies. He was seen on 09/11/23   He was prescribed both prednisone and abx. He is now feeling much better.        Past Medical History:  Diagnosis Date   Asthma    Depression      Family History  Problem Relation Age of Onset   Healthy Mother    Healthy Father      Current Outpatient Medications:    ADVAIR DISKUS 100-50 MCG/ACT AEPB, INHALE 1 PUFF INTO THE LUNGS TWICE A DAY (Patient taking differently: Inhale 1 puff into the lungs 2 (two) times daily. Last used: 09-07-2023), Disp: 180 each, Rfl: 1   albuterol (PROVENTIL) (2.5 MG/3ML) 0.083% nebulizer solution, Take 5 mg by nebulization every 6 (six) hours as needed for wheezing or shortness of breath. Using with daughter (her medicine and nebulizer). Last used: 09-08-2023, Disp: , Rfl:    albuterol (VENTOLIN HFA) 108 (90 Base) MCG/ACT inhaler, INHALE 2 PUFFS INTO THE LUNGS EVERY 4 HOURS AS NEEDED FOR WHEEZING OR SHORTNESS OF BREATH. (Patient taking differently: Inhale 2 puffs into the lungs every 4 (four) hours as needed for wheezing or shortness of breath. Last used: 09-08-2023, No aerochamber.), Disp: 25.5 each, Rfl: 2   amoxicillin-clavulanate (AUGMENTIN) 875-125 MG tablet, Take 1 tablet by mouth every 12 (twelve) hours., Disp: 14 tablet, Rfl: 0   BD PEN NEEDLE NANO 2ND GEN 32G X 4 MM MISC, , Disp: , Rfl:    benzonatate (TESSALON) 100 MG capsule, Take 1 capsule (100 mg total) by mouth every 8 (eight) hours as needed for  cough., Disp: 21 capsule, Rfl: 0   cetirizine (ZYRTEC) 10 MG tablet, Take 1 tablet (10 mg total) by mouth daily., Disp: 30 tablet, Rfl: 0   desonide (DESOWEN) 0.05 % lotion, APPLY TOPICALLY 2 TIMES DAILY AS NEEDED., Disp: 59 mL, Rfl: 0   diazepam (VALIUM) 10 MG tablet, Take 10 mg by mouth daily., Disp: , Rfl:    EPINEPHRINE 0.3 mg/0.3 mL IJ SOAJ injection, INJECT 0.3 MG INTO THE MUSCLE AS NEEDED., Disp: 6 each, Rfl: 1   finasteride (PROPECIA) 1 MG tablet, Take 1 tablet by mouth daily., Disp: , Rfl:    Fluocinolone Acetonide Scalp 0.01 % OIL, Apply 1 Application topically as directed. Topical 2-3 Times Weekly, Disp: , Rfl:    Fluocinolone Acetonide Scalp 0.01 % OIL, Apply 1 Application topically as directed. Topical 2-3 Times Weekl, Disp: , Rfl:    ibuprofen (ADVIL) 800 MG tablet, Take 1 tablet (800 mg total) by mouth every 8 (eight) hours as needed., Disp: 30 tablet, Rfl: 0   ketoconazole (NIZORAL) 2 % shampoo, Apply 1 Application topically 2 (two) times a week., Disp: , Rfl:    levocetirizine (XYZAL) 5 MG tablet, TAKE 1 TABLET BY MOUTH EVERY DAY IN THE EVENING, Disp: 90 tablet, Rfl: 1   Multiple Vitamin (MULTIVITAMIN ADULT PO), Take by mouth., Disp: , Rfl:    Multiple Vitamin (MULTIVITAMIN  WITH MINERALS) TABS, Take 1 tablet by mouth daily., Disp: , Rfl:    scopolamine (TRANSDERM SCOP, 1.5 MG,) 1 MG/3DAYS, Place 1 patch onto the skin every 3 (three) days., Disp: , Rfl:    Vitamin D, Ergocalciferol, (DRISDOL) 1.25 MG (50000 UNIT) CAPS capsule, Take 1 capsule (50,000 Units total) by mouth every 7 (seven) days., Disp: 5 capsule, Rfl: 0   fluticasone (FLONASE) 50 MCG/ACT nasal spray, Place 1 spray into both nostrils daily for 3 days., Disp: 16 g, Rfl: 0   minoxidil (LONITEN) 2.5 MG tablet, Take 2.5 mg by mouth daily. (Patient not taking: Reported on 09/19/2023), Disp: , Rfl:    Allergies  Allergen Reactions   Bee Venom Swelling     Men's preventive visit. Patient Health Questionnaire (PHQ-2) is   Flowsheet Row Office Visit from 09/19/2023 in [facility]  PHQ-2 Total Score 0     . Patient is on a healthy diet. Marital status: Single. Relevant history for alcohol use is:  Social History   Substance and Sexual Activity  Alcohol Use Yes   Comment: socially  . Relevant history for tobacco use is:  Social History   Tobacco Use  Smoking Status Never   Passive exposure: Never  Smokeless Tobacco Never  .   Review of Systems  Constitutional: Negative.   HENT: Negative.    Eyes: Negative.   Respiratory: Negative.    Gastrointestinal: Negative.   Endocrine: Negative.   Genitourinary: Negative.   Musculoskeletal:  Positive for arthralgias.       He adds, experiencing pain in both of his shoulders. Denies fall/trauma. He drives for [employer].  At home he uses a heating pad. He admits this does help temporarily.  He has taken Tylenol without relief of his symptoms. He is not currently established with the orthopedic.   Skin: Negative.   Allergic/Immunologic: Negative.   Neurological: Negative.   Hematological: Negative.      Today's Vitals   09/19/23 1419  BP: 118/82  Pulse: 66  Temp: 98.1 F (36.7 C)  SpO2: 98%  Weight: 245 lb 3.2 oz (111.2 kg)  Height: 6\' 1"  (1.854 m)   Body mass index is 32.35 kg/m.  Wt Readings from Last 3 Encounters:  09/19/23 245 lb 3.2 oz (111.2 kg)  09/11/23 240 lb (108.9 kg)  08/22/23 241 lb (109.3 kg)    Objective:  Physical Exam Vitals and nursing note reviewed.  Constitutional:      Appearance: Normal appearance. He is obese.  HENT:     Head: Normocephalic and atraumatic.     Right Ear: Tympanic membrane, ear canal and external ear normal.     Left Ear: Tympanic membrane, ear canal and external ear normal.     Nose: Nose normal.     Mouth/Throat:     Mouth: Mucous membranes are moist.     Pharynx: Oropharynx is clear.  Eyes:     Extraocular Movements: Extraocular movements intact.      Conjunctiva/sclera: Conjunctivae normal.     Pupils: Pupils are equal, round, and reactive to light.  Cardiovascular:     Rate and Rhythm: Normal rate and regular rhythm.     Pulses: Normal pulses.     Heart sounds: Normal heart sounds.  Pulmonary:     Effort: Pulmonary effort is normal.     Breath sounds: Normal breath sounds.  Chest:  Breasts:    Right: Normal. No swelling, bleeding, inverted nipple, mass or nipple discharge.  Left: Normal. No swelling, bleeding, inverted nipple, mass or nipple discharge.  Abdominal:     General: Bowel sounds are normal.     Palpations: Abdomen is soft.  Genitourinary:    Comments: Deferred  Musculoskeletal:        General: Tenderness present. Normal range of motion.     Cervical back: Normal range of motion and neck supple.  Skin:    General: Skin is warm.  Neurological:     General: No focal deficit present.     Mental Status: He is alert.  Psychiatric:        Mood and Affect: Mood normal.        Behavior: Behavior normal.         Assessment And Plan:    Encounter for annual health examination Assessment & Plan: A full exam was performed.  DRE deferred.  He is advised to get 30-45 minutes of regular exercise, no less than four to five days per week. Both weight-bearing and aerobic exercises are recommended.  He is advised to follow a healthy diet with at least six fruits/veggies per day, decrease intake of red meat and other saturated fats and to increase fish intake to twice weekly.  Meats/fish should not be fried -- baked, boiled or broiled is preferable. It is also important to cut back on your sugar intake.  Be sure to read labels - try to avoid anything with added sugar, high fructose corn syrup or other sweeteners.  If you must use a sweetener, you can try stevia or monkfruit.  It is also important to avoid artificially sweetened foods/beverages and diet drinks. Lastly, wear SPF 50 sunscreen on exposed skin and when in direct  sunlight for an extended period of time.  Be sure to avoid fast food restaurants and aim for at least 60 ounces of water daily.      Orders: -     CMP14+EGFR -     Hemoglobin A1c -     CBC -     PSA  Chronic pain of both shoulders Assessment & Plan: Chronic, previously seen by Cristi Loron. Will refer him back to Dr. Karen Chafe for further evaluation.   Orders: -     Ambulatory referral to Orthopedic Surgery -     ANA, IFA (with reflex) -     Sedimentation rate  Peroneal tendinitis of left lower leg Assessment & Plan: Records from [facility] reviewed in full detail. He agrees to discuss further with Ortho to confirm diagnosis. He may benefit from orthotics, compression and anti-inflammatory meds.   Orders: -     Ambulatory referral to Orthopedic Surgery  Hepatic steatosis Assessment & Plan: Most recent labs from [facility] and RUQ u/s results reviewed in [facility]. He is encouraged to decrease his sugar intake and intake of processed foods and meats. This may improve with weight loss. He may also benefit from dandelion root tea; however, he does have allergies. He could also try ginger/turmeric tea to decrease inflammation.    Class 1 obesity due to excess calories without serious comorbidity with body mass index (BMI) of 32.0 to 32.9 in adult Assessment & Plan: He is now followed by [facility]. He is encouraged to aim for at least 150 minutes of exercise per week.    She is encouraged to strive for BMI less than 30 to decrease cardiac risk. Advised to aim for at least 150 minutes of exercise per week.    Return in about 6 months (  around 03/19/2024), or med check, for 1 year physical. Patient was given opportunity to ask questions. Patient verbalized understanding of the plan and was able to repeat key elements of the plan. All questions were answered to their satisfaction.   I, Gwynneth Aliment, MD, have reviewed all documentation for this visit. The documentation on  09/19/23 for the exam, diagnosis, procedures, and orders are all accurate and complete.

## 2023-09-19 NOTE — Patient Instructions (Addendum)
Voltaren gel - apply to affected area two to three times daily as needed You can get this over the counter  Health Maintenance, Male Adopting a healthy lifestyle and getting preventive care are important in promoting health and wellness. Ask your health care provider about: The right schedule for you to have regular tests and exams. Things you can do on your own to prevent diseases and keep yourself healthy. What should I know about diet, weight, and exercise? Eat a healthy diet  Eat a diet that includes plenty of vegetables, fruits, low-fat dairy products, and lean protein. Do not eat a lot of foods that are high in solid fats, added sugars, or sodium. Maintain a healthy weight Body mass index (BMI) is a measurement that can be used to identify possible weight problems. It estimates body fat based on height and weight. Your health care provider can help determine your BMI and help you achieve or maintain a healthy weight. Get regular exercise Get regular exercise. This is one of the most important things you can do for your health. Most adults should: Exercise for at least 150 minutes each week. The exercise should increase your heart rate and make you sweat (moderate-intensity exercise). Do strengthening exercises at least twice a week. This is in addition to the moderate-intensity exercise. Spend less time sitting. Even light physical activity can be beneficial. Watch cholesterol and blood lipids Have your blood tested for lipids and cholesterol at 42 years of age, then have this test every 5 years. You may need to have your cholesterol levels checked more often if: Your lipid or cholesterol levels are high. You are older than 42 years of age. You are at high risk for heart disease. What should I know about cancer screening? Many types of cancers can be detected early and may often be prevented. Depending on your health history and family history, you may need to have cancer screening at  various ages. This may include screening for: Colorectal cancer. Prostate cancer. Skin cancer. Lung cancer. What should I know about heart disease, diabetes, and high blood pressure? Blood pressure and heart disease High blood pressure causes heart disease and increases the risk of stroke. This is more likely to develop in people who have high blood pressure readings or are overweight. Talk with your health care provider about your target blood pressure readings. Have your blood pressure checked: Every 3-5 years if you are 42-52 years of age. Every year if you are 51 years old or older. If you are between the ages of 55 and 79 and are a current or former smoker, ask your health care provider if you should have a one-time screening for abdominal aortic aneurysm (AAA). Diabetes Have regular diabetes screenings. This checks your fasting blood sugar level. Have the screening done: Once every three years after age 59 if you are at a normal weight and have a low risk for diabetes. More often and at a younger age if you are overweight or have a high risk for diabetes. What should I know about preventing infection? Hepatitis B If you have a higher risk for hepatitis B, you should be screened for this virus. Talk with your health care provider to find out if you are at risk for hepatitis B infection. Hepatitis C Blood testing is recommended for: Everyone born from [birth year] through 1965. Anyone with known risk factors for hepatitis C. Sexually transmitted infections (STIs) You should be screened each year for STIs, including gonorrhea and chlamydia, if:  You are sexually active and are younger than 42 years of age. You are older than 42 years of age and your health care provider tells you that you are at risk for this type of infection. Your sexual activity has changed since you were last screened, and you are at increased risk for chlamydia or gonorrhea. Ask your health care provider if you are at  risk. Ask your health care provider about whether you are at high risk for HIV. Your health care provider may recommend a prescription medicine to help prevent HIV infection. If you choose to take medicine to prevent HIV, you should first get tested for HIV. You should then be tested every 3 months for as long as you are taking the medicine. Follow these instructions at home: Alcohol use Do not drink alcohol if your health care provider tells you not to drink. If you drink alcohol: Limit how much you have to 0-2 drinks a day. Know how much alcohol is in your drink. In the [place] one drink equals one 12 oz bottle of beer (355 mL), one 5 oz glass of wine (148 mL), or one 1 oz glass of hard liquor (44 mL). Lifestyle Do not use any products that contain nicotine or tobacco. These products include cigarettes, chewing tobacco, and vaping devices, such as e-cigarettes. If you need help quitting, ask your health care provider. Do not use street drugs. Do not share needles. Ask your health care provider for help if you need support or information about quitting drugs. General instructions Schedule regular health, dental, and eye exams. Stay current with your vaccines. Tell your health care provider if: You often feel depressed. You have ever been abused or do not feel safe at home. Summary Adopting a healthy lifestyle and getting preventive care are important in promoting health and wellness. Follow your health care provider's instructions about healthy diet, exercising, and getting tested or screened for diseases. Follow your health care provider's instructions on monitoring your cholesterol and blood pressure. This information is not intended to replace advice given to you by your health care provider. Make sure you discuss any questions you have with your health care provider. Document Revised: 04/06/2021 Document Reviewed: 04/06/2021 Elsevier Patient Education  2024 [employer].

## 2023-09-19 NOTE — Assessment & Plan Note (Signed)
A full exam was performed.  DRE deferred.  He is advised to get 30-45 minutes of regular exercise, no less than four to five days per week. Both weight-bearing and aerobic exercises are recommended.  He is advised to follow a healthy diet with at least six fruits/veggies per day, decrease intake of red meat and other saturated fats and to increase fish intake to twice weekly.  Meats/fish should not be fried -- baked, boiled or broiled is preferable. It is also important to cut back on your sugar intake.  Be sure to read labels - try to avoid anything with added sugar, high fructose corn syrup or other sweeteners.  If you must use a sweetener, you can try stevia or monkfruit.  It is also important to avoid artificially sweetened foods/beverages and diet drinks. Lastly, wear SPF 50 sunscreen on exposed skin and when in direct sunlight for an extended period of time.  Be sure to avoid fast food restaurants and aim for at least 60 ounces of water daily.

## 2023-09-19 NOTE — Assessment & Plan Note (Signed)
He is now followed by [facility]. He is encouraged to aim for at least 150 minutes of exercise per week.

## 2023-09-19 NOTE — Assessment & Plan Note (Addendum)
Records from [facility] reviewed in full detail. He agrees to discuss further with Ortho to confirm diagnosis. He may benefit from orthotics, compression and anti-inflammatory meds.

## 2023-09-19 NOTE — Assessment & Plan Note (Signed)
Chronic, previously seen by Cristi Loron. Will refer him back to Dr. Karen Chafe for further evaluation.

## 2023-09-20 LAB — CMP14+EGFR
ALT: 35 [IU]/L (ref 0–44)
AST: 22 [IU]/L (ref 0–40)
Albumin: 4.2 g/dL (ref 4.1–5.1)
Alkaline Phosphatase: 55 [IU]/L (ref 44–121)
BUN/Creatinine Ratio: 11 (ref 9–20)
BUN: 10 mg/dL (ref 6–24)
Bilirubin Total: 0.6 mg/dL (ref 0.0–1.2)
CO2: 26 mmol/L (ref 20–29)
Calcium: 9.4 mg/dL (ref 8.7–10.2)
Chloride: 101 mmol/L (ref 96–106)
Creatinine, Ser: 0.94 mg/dL (ref 0.76–1.27)
Globulin, Total: 2.4 g/dL (ref 1.5–4.5)
Glucose: 98 mg/dL (ref 70–99)
Potassium: 4.2 mmol/L (ref 3.5–5.2)
Sodium: 142 mmol/L (ref 134–144)
Total Protein: 6.6 g/dL (ref 6.0–8.5)
eGFR: 104 mL/min/{1.73_m2} (ref >59)

## 2023-09-20 LAB — HEMOGLOBIN A1C
Est. average glucose Bld gHb Est-mCnc: 137 mg/dL
Hgb A1c MFr Bld: 6.4 % — ABNORMAL HIGH (ref 4.8–5.6)

## 2023-09-20 LAB — CBC
Hematocrit: 45.3 % (ref 37.5–51.0)
Hemoglobin: 14.5 g/dL (ref 13.0–17.7)
MCH: 25.6 pg — ABNORMAL LOW (ref 26.6–33.0)
MCHC: 32 g/dL (ref 31.5–35.7)
MCV: 80 fL (ref 79–97)
Platelets: 323 10*3/uL (ref 150–450)
RBC: 5.66 x10E6/uL (ref 4.14–5.80)
RDW: 14 % (ref 11.6–15.4)
WBC: 4.9 10*3/uL (ref 3.4–10.8)

## 2023-09-20 LAB — PSA: Prostate Specific Ag, Serum: 0.4 ng/mL (ref 0.0–4.0)

## 2023-09-20 LAB — SEDIMENTATION RATE: Sed Rate: 8 mm/h (ref 0–15)

## 2023-09-20 LAB — ANTINUCLEAR ANTIBODIES, IFA: ANA Titer 1: NEGATIVE

## 2023-09-21 ENCOUNTER — Other Ambulatory Visit: Payer: Self-pay

## 2023-09-22 ENCOUNTER — Other Ambulatory Visit: Payer: Self-pay

## 2023-09-22 ENCOUNTER — Other Ambulatory Visit: Payer: Self-pay | Admitting: Family Medicine

## 2023-09-22 DIAGNOSIS — E559 Vitamin D deficiency, unspecified: Secondary | ICD-10-CM

## 2023-09-22 MED ORDER — FLUTICASONE-SALMETEROL 100-50 MCG/ACT IN AEPB: 1.0000 | INHALATION_SPRAY | Freq: Two times a day (BID) | RESPIRATORY_TRACT | 2 refills | Status: DC

## 2023-09-22 MED ORDER — ALBUTEROL SULFATE HFA 108 (90 BASE) MCG/ACT IN AERS: INHALATION_SPRAY | RESPIRATORY_TRACT | 2 refills | Status: DC

## 2023-09-22 MED ORDER — ALBUTEROL SULFATE (2.5 MG/3ML) 0.083% IN NEBU: 5.0000 mg | INHALATION_SOLUTION | Freq: Four times a day (QID) | RESPIRATORY_TRACT | 2 refills | Status: DC | PRN

## 2023-09-22 MED ORDER — SCOPOLAMINE 1 MG/3DAYS TD PT72: 1.0000 | MEDICATED_PATCH | TRANSDERMAL | 2 refills | Status: DC

## 2023-09-22 MED ORDER — EPINEPHRINE 0.3 MG/0.3ML IJ SOAJ: INTRAMUSCULAR | 1 refills | Status: DC

## 2023-09-23 ENCOUNTER — Other Ambulatory Visit: Payer: Self-pay | Admitting: Internal Medicine

## 2023-09-26 ENCOUNTER — Ambulatory Visit: Payer: BC Managed Care – PPO | Admitting: Family Medicine

## 2023-09-26 ENCOUNTER — Encounter: Payer: Self-pay | Admitting: Family Medicine

## 2023-09-26 VITALS — BP 111/67 | HR 69 | Temp 98.2°F | Ht 72.0 in | Wt 240.0 lb

## 2023-09-26 DIAGNOSIS — E559 Vitamin D deficiency, unspecified: Secondary | ICD-10-CM | POA: Diagnosis not present

## 2023-09-26 DIAGNOSIS — R7303 Prediabetes: Secondary | ICD-10-CM | POA: Diagnosis not present

## 2023-09-26 DIAGNOSIS — E88819 Insulin resistance, unspecified: Secondary | ICD-10-CM

## 2023-09-26 DIAGNOSIS — R5383 Other fatigue: Secondary | ICD-10-CM | POA: Diagnosis not present

## 2023-09-26 DIAGNOSIS — E669 Obesity, unspecified: Secondary | ICD-10-CM | POA: Diagnosis not present

## 2023-09-26 DIAGNOSIS — Z6832 Body mass index (BMI) 32.0-32.9, adult: Secondary | ICD-10-CM

## 2023-09-26 MED ORDER — QSYMIA 3.75-23 MG PO CP24: ORAL_CAPSULE | ORAL | 0 refills | Status: DC

## 2023-09-26 MED ORDER — VITAMIN D (ERGOCALCIFEROL) 1.25 MG (50000 UNIT) PO CAPS: 50000.0000 [IU] | ORAL_CAPSULE | ORAL | 0 refills | Status: DC

## 2023-09-26 NOTE — Assessment & Plan Note (Addendum)
Lab Results  Component Value Date   HGBA1C 6.4 (H) 09/19/2023   He is  doing well with a 5.5% TBW loss in the past 8 mos of medically supervised weight management, working on dietary changes and regular exercise.  He has declined starting metformin.  Consider use of a GLP-1 receptor agonist if A1c is > 6.5.

## 2023-09-26 NOTE — Progress Notes (Addendum)
Office: [phone]  /  Fax: [phone]  WEIGHT SUMMARY AND BIOMETRICS  Starting Date: 12/30/22  Starting Weight: 254 lb   Weight Lost Since Last Visit: 1 lb   Vitals Temp: 98.2 F (36.8 C) BP: 111/67 Pulse Rate: 69 SpO2: 98 %   Body Composition  Body Fat %: 28.4 % Fat Mass (lbs): 68.2 lbs Muscle Mass (lbs): 163.8 lbs Total Body Water (lbs): 114 lbs Visceral Fat Rating : 13     HPI  Chief Complaint: OBESITY  Brady Wells is here to discuss his progress with his obesity treatment plan. He is on the keeping a food journal and adhering to recommended goals of 2200 calories and 150 protein and states he is following his eating plan approximately 90 % of the time. He states he is exercising weights and cardio for 90 minutes 3-4 times per week.   Interval History:  Since last office visit he is down 1 lb He is eating out less often He is up 2.6 lb of muscle mass and down 3.2 lb of body fat in the past month He has a net weight loss of 14 lb in the past 8 mos He works nights and his hours keep changing He has been mindful of his food choices and is getting in lean protein with meals He is not skipping meals  Pharmacotherapy: none  PHYSICAL EXAM:  Blood pressure 111/67, pulse 69, temperature 98.2 F (36.8 C), height 6' (1.829 m), weight 240 lb (108.9 kg), SpO2 98%. Body mass index is 32.55 kg/m.  General: He has an athletic build, cooperative, alert, well developed, and in no acute distress. PSYCH: Has normal mood, affect and thought process.   Lungs: Normal breathing effort, no conversational dyspnea.   ASSESSMENT AND PLAN  TREATMENT PLAN FOR OBESITY:  Recommended Dietary Goals  Brady Wells is currently in the action stage of change. As such, his goal is to continue weight management plan. He has agreed to keeping a food journal and adhering to recommended goals of 2200 calories and 150 g of  protein.  Behavioral Intervention  We discussed the following  Behavioral Modification Strategies today: increasing lean protein intake to established goals, increasing vegetables, increasing water intake , work on tracking and journaling calories using tracking application, keeping healthy foods at home, continue to practice mindfulness when eating, better snacking choices, and continue to work on maintaining a reduced calorie state, getting the recommended amount of protein, incorporating whole foods, making healthy choices, staying well hydrated and practicing mindfulness when eating..  Additional resources provided today: NA  Recommended Physical Activity Goals  Brady Wells has been advised to work up to 150 minutes of moderate intensity aerobic activity a week and strengthening exercises 2-3 times per week for cardiovascular health, weight loss maintenance and preservation of muscle mass.   He has agreed to Start aerobic activity with a goal of 150 minutes a week at moderate intensity.  and Increase the intensity, frequency or duration of strengthening exercises   Pharmacotherapy changes for the treatment of obesity: start Qsymia 3.75/23 mg qAM PDMP reviewed; informed consent signed.  Reviewed [email or web address] information on medication and potential adverse SE.  BP/ HR at goal.   ASSOCIATED CONDITIONS ADDRESSED TODAY  Pre-diabetes Assessment & Plan: Lab Results  Component Value Date   HGBA1C 6.4 (H) 09/19/2023   He is  doing well with a 5.5% TBW loss in the past 8 mos of medically supervised weight management, working on dietary changes and regular exercise.  He has declined  starting metformin.  Consider use of a GLP-1 receptor agonist if A1c is > 6.5.  Orders: -     Hemoglobin A1c  Vitamin D deficiency Assessment & Plan: Last vitamin D Lab Results  Component Value Date   VD25OH 46.0 05/23/2023   He has done well on RX vitamin D weekly Energy level has been improving  Repeat lab today  Orders: -     Vitamin D (Ergocalciferol); Take 1 capsule  (50,000 Units total) by mouth every 7 (seven) days.  Dispense: 5 capsule; Refill: 0 -     VITAMIN D 25 Hydroxy (Vit-D Deficiency, Fractures)  Insulin resistance -     Insulin, random  Other fatigue -     Testosterone  Generalized obesity -     Qsymia; 1 capsule po daily with first meal of the day  Dispense: 30 capsule; Refill: 0  BMI 32.0-32.9,adult      He was informed of the importance of frequent follow up visits to maximize his success with intensive lifestyle modifications for his multiple health conditions.   ATTESTASTION STATEMENTS:  Reviewed by clinician on day of visit: allergies, medications, problem list, medical history, surgical history, family history, social history, and previous encounter notes pertinent to obesity diagnosis.   I have personally spent 30 minutes total time today in preparation, patient care, nutritional counseling and documentation for this visit, including the following: review of clinical lab tests; review of medical tests/procedures/services.      Glennis Brink, DO DABFM, DABOM [facility] [address] [place], [place] [address] [phone]

## 2023-09-26 NOTE — Assessment & Plan Note (Signed)
Last vitamin D Lab Results  Component Value Date   VD25OH 46.0 05/23/2023   He has done well on RX vitamin D weekly Energy level has been improving  Repeat lab today

## 2023-09-27 ENCOUNTER — Other Ambulatory Visit: Payer: Self-pay | Admitting: Internal Medicine

## 2023-09-27 LAB — INSULIN, RANDOM: INSULIN: 27.3 u[IU]/mL — ABNORMAL HIGH (ref 2.6–24.9)

## 2023-09-27 LAB — HEMOGLOBIN A1C
Est. average glucose Bld gHb Est-mCnc: 140 mg/dL
Hgb A1c MFr Bld: 6.5 % — ABNORMAL HIGH (ref 4.8–5.6)

## 2023-09-27 LAB — VITAMIN D 25 HYDROXY (VIT D DEFICIENCY, FRACTURES): Vit D, 25-Hydroxy: 65.8 ng/mL (ref 30.0–100.0)

## 2023-09-27 LAB — TESTOSTERONE: Testosterone: 505 ng/dL (ref 264–916)

## 2023-10-02 ENCOUNTER — Encounter (HOSPITAL_BASED_OUTPATIENT_CLINIC_OR_DEPARTMENT_OTHER): Payer: Self-pay

## 2023-10-02 ENCOUNTER — Emergency Department (HOSPITAL_BASED_OUTPATIENT_CLINIC_OR_DEPARTMENT_OTHER)
Admission: EM | Admit: 2023-10-02 | Discharge: 2023-10-02 | Disposition: A | Discharge status: Home / Self Care | Payer: BC Managed Care – PPO | Attending: Emergency Medicine | Admitting: Emergency Medicine

## 2023-10-02 ENCOUNTER — Ambulatory Visit: Payer: BC Managed Care – PPO

## 2023-10-02 ENCOUNTER — Ambulatory Visit
Admission: EM | Admit: 2023-10-02 | Discharge: 2023-10-02 | Disposition: A | Discharge status: Home / Self Care | Payer: BC Managed Care – PPO | Source: Home / Self Care

## 2023-10-02 ENCOUNTER — Other Ambulatory Visit: Payer: Self-pay

## 2023-10-02 DIAGNOSIS — R509 Fever, unspecified: Secondary | ICD-10-CM | POA: Diagnosis present

## 2023-10-02 DIAGNOSIS — B349 Viral infection, unspecified: Secondary | ICD-10-CM | POA: Diagnosis not present

## 2023-10-02 DIAGNOSIS — Z20822 Contact with and (suspected) exposure to covid-19: Secondary | ICD-10-CM | POA: Insufficient documentation

## 2023-10-02 DIAGNOSIS — R0602 Shortness of breath: Secondary | ICD-10-CM | POA: Diagnosis not present

## 2023-10-02 DIAGNOSIS — R6883 Chills (without fever): Secondary | ICD-10-CM

## 2023-10-02 DIAGNOSIS — R110 Nausea: Secondary | ICD-10-CM | POA: Insufficient documentation

## 2023-10-02 DIAGNOSIS — Z1152 Encounter for screening for COVID-19: Secondary | ICD-10-CM | POA: Insufficient documentation

## 2023-10-02 DIAGNOSIS — B9789 Other viral agents as the cause of diseases classified elsewhere: Secondary | ICD-10-CM

## 2023-10-02 LAB — POCT URINALYSIS DIP (MANUAL ENTRY)
Blood, UA: NEGATIVE
Glucose, UA: NEGATIVE mg/dL
Ketones, POC UA: NEGATIVE mg/dL
Leukocytes, UA: NEGATIVE
Nitrite, UA: NEGATIVE
Protein Ur, POC: 100 mg/dL — AB
Spec Grav, UA: 1.025 (ref 1.010–1.025)
Urobilinogen, UA: 2 U/dL — AB
pH, UA: 7 (ref 5.0–8.0)

## 2023-10-02 MED ORDER — KETOROLAC TROMETHAMINE 30 MG/ML IJ SOLN
30.0000 mg | Freq: Once | INTRAMUSCULAR | Status: AC
  Administered 2023-10-02: 30 mg via INTRAMUSCULAR

## 2023-10-02 MED ORDER — ONDANSETRON 4 MG PO TBDP
4.0000 mg | ORAL_TABLET | Freq: Three times a day (TID) | ORAL | 0 refills | Status: AC | PRN
Start: 2023-10-02 — End: ?

## 2023-10-02 MED ORDER — IBUPROFEN 800 MG PO TABS
800.0000 mg | ORAL_TABLET | Freq: Once | ORAL | Status: AC
  Administered 2023-10-02: 800 mg via ORAL
  Filled 2023-10-02: qty 1

## 2023-10-02 NOTE — ED Provider Notes (Signed)
[facility]    CSN: [id number] Arrival date & time: 10/02/23  4098      History   Chief Complaint No chief complaint on file.   HPI Brady Wells is a 42 y.o. male.   Patient reports that he has been having nausea without vomiting, intermittent headaches, chills, sweats for approximately 1 week.  Patient was seen at urgent care on 09/11/2023 and treated for upper respiratory infection versus asthma exacerbation with Augmentin and prednisone.  Reports that he started feeling much better, but then started having these current symptoms about a week ago.  Reports that he was not having the symptoms at previous urgent care visit and was simply having upper respiratory symptoms and chest congestion which has now resolved.  He states that he is intermittently short of breath but he does have asthma where he has been using albuterol nebulizer treatment with minimal improvement.  Headaches are intermittent and rated 6/10 on pain scale.  He does report that he has a headache at this time.  Denies any recent falls or head injuries.  Denies history of migraines.  He reports that he has been having difficulty tolerating foods but has been attempting to drink fluids.  Denies diarrhea and is having normal bowel movements. Patient is able to urinate. Denies any new known sick contacts.  He has not checked his temperature with a thermometer at home but reports that he has been having to wear blankets given he has been having chills.  Has been taking NyQuil and Tylenol as well for symptoms.     Past Medical History:  Diagnosis Date   Asthma    Depression     Patient Active Problem List   Diagnosis Date Noted   Encounter for annual health examination 09/19/2023   Chronic pain of both shoulders 09/19/2023   Class 1 obesity due to excess calories without serious comorbidity with body mass index (BMI) of 32.0 to 32.9 in adult 09/19/2023   Hepatic steatosis 09/19/2023   Chronic pain of  left ankle 09/19/2023   Abdominal bloating 09/11/2023   Abnormal weight loss 09/11/2023   Family history of malignant neoplasm of digestive organs 09/11/2023   Seborrheic dermatitis 07/04/2023   Skin inflammation 07/04/2023   Elevated liver enzymes 06/27/2023   Polyphagia 06/27/2023   Insulin resistance 01/13/2023   Vitamin D deficiency 01/13/2023   SOBOE (shortness of breath on exertion) 12/30/2022   1st degree AV block 12/30/2022   Generalized obesity with starting BMI 34 12/30/2022   Pre-diabetes 11/09/2022   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 Authorizing Provider  albuterol (PROVENTIL) (2.5 MG/3ML) 0.083% nebulizer solution Take 6 mLs (5 mg total) by nebulization every 6 (six) hours as needed for wheezing or shortness of breath. Using with daughter (her medicine and nebulizer). Last used: 09-08-2023 09/22/23   Dorothyann Peng, MD  albuterol (VENTOLIN HFA) 108 (90 Base) MCG/ACT inhaler INHALE  2 PUFFS INTO THE LUNGS EVERY 4 HOURS AS NEEDED FOR WHEEZING OR SHORTNESS OF BREATH. 09/22/23   Dorothyann Peng, MD  amoxicillin-clavulanate (AUGMENTIN) 875-125 MG tablet Take 1 tablet by mouth every 12 (twelve) hours. 09/11/23   Tomi Bamberger, PA-C  BD PEN NEEDLE NANO 2ND GEN 32G X 4 MM MISC  08/21/23   [provider]  benzonatate (TESSALON) 100 MG capsule Take 1 capsule (100 mg total) by mouth every 8  (eight) hours as needed for cough. 02/24/22   Dorothyann Peng, MD  cetirizine (ZYRTEC) 10 MG tablet Take 1 tablet (10 mg total) by mouth daily. 09/01/21   Gustavus Bryant, FNP  desonide (DESOWEN) 0.05 % lotion APPLY TOPICALLY 2 TIMES DAILY AS NEEDED. 08/31/22   Dorothyann Peng, MD  diazepam (VALIUM) 10 MG tablet Take 10 mg by mouth daily. 12/02/22   [provider]  EPINEPHRINE 0.3 mg/0.3 mL IJ SOAJ injection INJECT 0.3MG  INTO THE MUSCLE AS NEEDED. 09/27/23   Dorothyann Peng, MD  finasteride (PROPECIA) 1 MG tablet Take 1 tablet by mouth daily. 07/04/23   [provider]  Fluocinolone Acetonide Scalp 0.01 % OIL Apply 1 Application topically as directed. Topical 2-3 Times Weekly 07/04/23   [provider]  Fluocinolone Acetonide Scalp 0.01 % OIL Apply 1 Application topically as directed. Topical 2-3 Times Weekl    [provider]  fluticasone (FLONASE) 50 MCG/ACT nasal spray Place 1 spray into both nostrils daily for 3 days. 12/31/21 06/28/22  Gustavus Bryant, FNP  fluticasone-salmeterol (ADVAIR DISKUS) 100-50 MCG/ACT AEPB Inhale 1 puff into the lungs 2 (two) times daily. Last used: 09-07-2023 09/22/23   Dorothyann Peng, MD  ibuprofen (ADVIL) 800 MG tablet Take 1 tablet (800 mg total) by mouth every 8 (eight) hours as needed. 02/20/21   Terrilee Files, MD  ketoconazole (NIZORAL) 2 % shampoo Apply 1 Application topically 2 (two) times a week. 07/04/23   [provider]  levocetirizine (XYZAL) 5 MG tablet TAKE 1 TABLET BY MOUTH EVERY DAY IN THE EVENING 08/18/22   Dorothyann Peng, MD  minoxidil (LONITEN) 2.5 MG tablet Take 2.5 mg by mouth daily. 07/04/23   [provider]  Multiple Vitamin (MULTIVITAMIN ADULT PO) Take by mouth. 09/14/18   [provider]  Multiple Vitamin (MULTIVITAMIN WITH MINERALS) TABS Take 1 tablet by mouth daily.    [provider]  Phentermine-Topiramate (QSYMIA) 3.75-23 MG CP24 1 capsule po daily with first meal of the day 09/26/23    Bowen, Scot Jun, DO  scopolamine (TRANSDERM SCOP, 1.5 MG,) 1 MG/3DAYS Place 1 patch (1.5 mg total) onto the skin every 3 (three) days. 09/22/23   Dorothyann Peng, MD  Vitamin D, Ergocalciferol, (DRISDOL) 1.25 MG (50000 UNIT) CAPS capsule Take 1 capsule (50,000 Units total) by mouth every 7 (seven) days. 09/26/23   Bowen, Scot Jun, DO    Family History Family History  Problem Relation Age of Onset   Healthy Mother    Healthy Father     Social History Social History   Tobacco Use   Smoking status: Never    Passive exposure: Never   Smokeless tobacco: Never  Vaping Use   Vaping status: Never Used  Substance Use Topics   Alcohol use: Yes    Comment: socially   Drug use: Never     Allergies   Bee venom   Review of Systems Review of Systems Per HPI  Physical Exam Triage Vital Signs ED Triage Vitals  Encounter Vitals Group  BP 10/02/23 0848 111/68     Systolic BP Percentile --      Diastolic BP Percentile --      Pulse Rate 10/02/23 0848 86     Resp 10/02/23 0848 17     Temp 10/02/23 0848 99.5 F (37.5 C)     Temp Source 10/02/23 0848 Oral     SpO2 10/02/23 0848 99 %     Weight 10/02/23 0847 242 lb (109.8 kg)     Height 10/02/23 0847 6\' 1"  (1.854 m)     Head Circumference --      Peak Flow --      Pain Score 10/02/23 0847 8     Pain Loc --      Pain Education --      Exclude from Growth Chart --    No data found.  Updated Vital Signs BP 111/68 (BP Location: Left Arm)   Pulse 86   Temp 99.5 F (37.5 C) (Oral)   Resp 17   Ht 6\' 1"  (1.854 m)   Wt 242 lb (109.8 kg)   SpO2 99%   BMI 31.93 kg/m   Visual Acuity Right Eye Distance:   Left Eye Distance:   Bilateral Distance:    Right Eye Near:   Left Eye Near:    Bilateral Near:     Physical Exam Constitutional:      General: He is not in acute distress.    Appearance: Normal appearance. He is not toxic-appearing or diaphoretic.  HENT:     Head: Normocephalic and atraumatic.     Right Ear:  Tympanic membrane and ear canal normal.     Left Ear: Tympanic membrane and ear canal normal.     Nose: Nose normal.     Mouth/Throat:     Mouth: Mucous membranes are moist.     Pharynx: No posterior oropharyngeal erythema.  Eyes:     Extraocular Movements: Extraocular movements intact.     Conjunctiva/sclera: Conjunctivae normal.     Pupils: Pupils are equal, round, and reactive to light.  Cardiovascular:     Rate and Rhythm: Normal rate and regular rhythm.     Pulses: Normal pulses.     Heart sounds: Normal heart sounds.  Pulmonary:     Effort: Pulmonary effort is normal. No respiratory distress.     Breath sounds: Normal breath sounds. No stridor. No wheezing, rhonchi or rales.  Abdominal:     General: Bowel sounds are normal. There is no distension.     Palpations: Abdomen is soft.     Tenderness: There is no abdominal tenderness.  Musculoskeletal:        General: Normal range of motion.     Cervical back: Normal range of motion.  Skin:    General: Skin is warm and dry.  Neurological:     General: No focal deficit present.     Mental Status: He is alert and oriented to person, place, and time. Mental status is at baseline.     Cranial Nerves: Cranial nerves 2-12 are intact.     Sensory: Sensation is intact.     Motor: Motor function is intact.     Coordination: Coordination is intact.     Gait: Gait is intact.  Psychiatric:        Mood and Affect: Mood normal.        Behavior: Behavior normal.        Thought Content: Thought content normal.  Judgment: Judgment normal.      UC Treatments / Results  Labs (all labs ordered are listed, but only abnormal results are displayed) Labs Reviewed  SARS CORONAVIRUS 2 (TAT 6-24 HRS)  POCT URINALYSIS DIP (MANUAL ENTRY)    EKG   Radiology No results found.  Procedures Procedures (including critical care time)  Medications Ordered in UC Medications  ketorolac (TORADOL) 30 MG/ML injection 30 mg (has no  administration in time range)    Initial Impression / Assessment and Plan / UC Course  I have reviewed the triage vital signs and the nursing notes.  Pertinent labs & imaging results that were available during my care of the patient were reviewed by me and considered in my medical decision making (see chart for details).     Differential diagnoses include acute viral illness versus pneumonia versus gastroenteritis versus migraine headache.  I am not sure exact etiology of patient's symptoms, but patient is sitting upright in chair in no acute distress.  Chest x-ray was completed today which was negative for any acute cardiopulmonary process.  UA unremarkable.  COVID test pending.  Patient recently had blood work a few days prior at PCP which was normal so do not think additional blood work is necessary.  I am most suspicious that patient has a new onset viral illness causing symptoms and headache.  IM Toradol was administered in urgent care with patient reporting improvement in headache which is reassuring.  Given no head injury and neuroexam being normal, I do not think that emergent evaluation or CT imaging of the head is necessary.  Although, patient was given strict ER precautions regarding symptoms today.  Will prescribe patient ondansetron to take as needed for nausea and advised patient to push fluids. Patient denies that he takes any daily medications so this should be safe. Also advised bland diet.  Advised no NSAIDs for at least 24 hours following injection today.  Advised patient to call his PCP tomorrow to schedule appointment for follow-up as well.  Patient verbalized understanding and was agreeable with plan. Final Clinical Impressions(s) / UC Diagnoses   Final diagnoses:  Encounter for laboratory testing for COVID-19 virus   Discharge Instructions   None    ED Prescriptions   None    PDMP not reviewed this encounter.   Gustavus Bryant, [place] 10/02/23 1054

## 2023-10-02 NOTE — Discharge Instructions (Addendum)
Urine was clear.  Chest x-ray normal.  I have prescribed you nausea medication to take as needed.  Please follow-up with your family medicine doctor tomorrow for further evaluation and management.  COVID test is pending.

## 2023-10-02 NOTE — ED Triage Notes (Signed)
Pt seen at [facility] earlier today for chills, and HA. Pt has had these sx x1 month. COVID test still in process. Pt has taken amox and prednisone with no relief. Pt continues to have fevers and feeling very ill. Chest XR normal done today.

## 2023-10-02 NOTE — Discharge Instructions (Signed)
Take tylenol 2 pills 4 times a day and motrin 4 pills 3 times a day.  Drink plenty of fluids.  Return for worsening shortness of breath, confusion. Follow up with your family doctor.   

## 2023-10-02 NOTE — ED Provider Notes (Signed)
[place] [facility] Provider Note   CSN: [id number] Arrival date & time: 10/02/23  2309     History  Chief Complaint  Patient presents with   Fever    Brady Wells is a 42 y.o. male.  42 yo M with a chief complaints of fevers chills myalgias has been going on for.  This is now his third visit during this illness.  He was seen in urgent care twice and was seen by his PCP.  Initially had some cough and congestion and was started on amoxicillin and prednisone when he had seen his family doctor better and then he had recurrence of symptoms over the past couple days.  Today went to urgent care and had a chest x-ray UA COVID test.  He was told he had recurrence of symptoms to come to the ED or call his PCP.    Fever      Home Medications Prior to Admission medications   Medication Sig Start Date End Date Taking? Authorizing Provider  albuterol (PROVENTIL) (2.5 MG/3ML) 0.083% nebulizer solution Take 6 mLs (5 mg total) by nebulization every 6 (six) hours as needed for wheezing or shortness of breath. Using with daughter (her medicine and nebulizer). Last used: 09-08-2023 09/22/23   Dorothyann Peng, MD  albuterol (VENTOLIN HFA) 108 (90 Base) MCG/ACT inhaler INHALE 2 PUFFS INTO THE LUNGS EVERY 4 HOURS AS NEEDED FOR WHEEZING OR SHORTNESS OF BREATH. 09/22/23   Dorothyann Peng, MD  amoxicillin-clavulanate (AUGMENTIN) 875-125 MG tablet Take 1 tablet by mouth every 12 (twelve) hours. 09/11/23   Tomi Bamberger, PA-C  BD PEN NEEDLE NANO 2ND GEN 32G X 4 MM MISC  08/21/23   [provider]  benzonatate (TESSALON) 100 MG capsule Take 1 capsule (100 mg total) by mouth every 8 (eight) hours as needed for cough. 02/24/22   Dorothyann Peng, MD  cetirizine (ZYRTEC) 10 MG tablet Take 1 tablet (10 mg total) by mouth daily. 09/01/21   Gustavus Bryant, FNP  desonide (DESOWEN) 0.05 % lotion APPLY TOPICALLY 2 TIMES DAILY AS NEEDED. 08/31/22   Dorothyann Peng, MD   diazepam (VALIUM) 10 MG tablet Take 10 mg by mouth daily. 12/02/22   [provider]  EPINEPHRINE 0.3 mg/0.3 mL IJ SOAJ injection INJECT 0.3MG  INTO THE MUSCLE AS NEEDED. 09/27/23   Dorothyann Peng, MD  finasteride (PROPECIA) 1 MG tablet Take 1 tablet by mouth daily. 07/04/23   [provider]  Fluocinolone Acetonide Scalp 0.01 % OIL Apply 1 Application topically as directed. Topical 2-3 Times Weekly 07/04/23   [provider]  Fluocinolone Acetonide Scalp 0.01 % OIL Apply 1 Application topically as directed. Topical 2-3 Times Weekl    [provider]  fluticasone (FLONASE) 50 MCG/ACT nasal spray Place 1 spray into both nostrils daily for 3 days. 12/31/21 06/28/22  Gustavus Bryant, FNP  fluticasone-salmeterol (ADVAIR DISKUS) 100-50 MCG/ACT AEPB Inhale 1 puff into the lungs 2 (two) times daily. Last used: 09-07-2023 09/22/23   Dorothyann Peng, MD  ibuprofen (ADVIL) 800 MG tablet Take 1 tablet (800 mg total) by mouth every 8 (eight) hours as needed. 02/20/21   Terrilee Files, MD  ketoconazole (NIZORAL) 2 % shampoo Apply 1 Application topically 2 (two) times a week. 07/04/23   [provider]  levocetirizine (XYZAL) 5 MG tablet TAKE 1 TABLET BY MOUTH EVERY DAY IN THE EVENING 08/18/22   Dorothyann Peng, MD  minoxidil (LONITEN) 2.5 MG tablet Take 2.5 mg by  mouth daily. 07/04/23   [provider]  Multiple Vitamin (MULTIVITAMIN ADULT PO) Take by mouth. 09/14/18   [provider]  Multiple Vitamin (MULTIVITAMIN WITH MINERALS) TABS Take 1 tablet by mouth daily.    [provider]  ondansetron (ZOFRAN-ODT) 4 MG disintegrating tablet Take 1 tablet (4 mg total) by mouth every 8 (eight) hours as needed for nausea or vomiting. 10/02/23   Gustavus Bryant, FNP  Phentermine-Topiramate (QSYMIA) 3.75-23 MG CP24 1 capsule po daily with first meal of the day 09/26/23   Bowen, Scot Jun, DO  scopolamine (TRANSDERM SCOP, 1.5 MG,) 1 MG/3DAYS Place 1 patch (1.5 mg total)  onto the skin every 3 (three) days. 09/22/23   Dorothyann Peng, MD  Vitamin D, Ergocalciferol, (DRISDOL) 1.25 MG (50000 UNIT) CAPS capsule Take 1 capsule (50,000 Units total) by mouth every 7 (seven) days. 09/26/23   Bowen, Scot Jun, DO      Allergies    Bee venom    Review of Systems   Review of Systems  Constitutional:  Positive for fever.    Physical Exam Updated Vital Signs BP 118/69 (BP Location: Left Arm)   Pulse 88   Temp (!) 101.3 F (38.5 C)   Resp 20   SpO2 100%  Physical Exam Vitals and nursing note reviewed.  Constitutional:      Appearance: He is well-developed.  HENT:     Head: Normocephalic and atraumatic.     Comments: Swollen turbinates, posterior nasal drip, no noted sinus ttp, tm normal bilaterally.   Eyes:     Pupils: Pupils are equal, round, and reactive to light.  Neck:     Vascular: No JVD.  Cardiovascular:     Rate and Rhythm: Normal rate and regular rhythm.     Heart sounds: No murmur heard.    No friction rub. No gallop.  Pulmonary:     Effort: No respiratory distress.     Breath sounds: No wheezing.  Abdominal:     General: There is no distension.     Tenderness: There is no abdominal tenderness. There is no guarding or rebound.  Musculoskeletal:        General: Normal range of motion.     Cervical back: Normal range of motion and neck supple.  Skin:    Coloration: Skin is not pale.     Findings: No rash.  Neurological:     Mental Status: He is alert and oriented to person, place, and time.  Psychiatric:        Behavior: Behavior normal.     ED Results / Procedures / Treatments   Labs (all labs ordered are listed, but only abnormal results are displayed) Labs Reviewed  RESP PANEL BY RT-PCR (RSV, FLU A&B, COVID)  [id number]    EKG None  Radiology DG Chest 2 View  Result Date: 10/02/2023 CLINICAL DATA:  Chills and shortness of breath EXAM: CHEST - 2 VIEW COMPARISON:  Chest x-ray dated November 10, 2021 FINDINGS: The heart size and  mediastinal contours are within normal limits. Both lungs are clear. The visualized skeletal structures are unremarkable. IMPRESSION: No active cardiopulmonary disease. Electronically Signed   By: Allegra Lai M.D.   On: 10/02/2023 09:48    Procedures Procedures    Medications Ordered in ED Medications  ibuprofen (ADVIL) tablet 800 mg (has no administration in time range)    ED Course/ Medical Decision Making/ A&P  Medical Decision Making Risk Prescription drug management.   42 yo M with a cc of fevers chills and myalgias.  Going on for a couple weeks.  The patient is well-appearing and nontoxic.  He appears well-hydrated.  There is no bacterial source found on exam.  He had a chest x-ray performed earlier today which I independently interpreted without focal infiltrate or pneumothorax.  I suspect the patient either has lingering upper respiratory illness or had 2 URIs back-to-back.  Encouraged him to follow-up with his family doctor in case his symptoms persist and they want him evaluated for possible immunodeficiency or autoimmune condition.  11:38 PM:  I have discussed the diagnosis/risks/treatment options with the patient and family.  Evaluation and diagnostic testing in the emergency department does not suggest an emergent condition requiring admission or immediate intervention beyond what has been performed at this time.  They will follow up with PCP. We also discussed returning to the ED immediately if new or worsening sx occur. We discussed the sx which are most concerning (e.g., sudden worsening pain, fever, inability to tolerate by mouth) that necessitate immediate return. Medications administered to the patient during their visit and any new prescriptions provided to the patient are listed below.  Medications given during this visit Medications  ibuprofen (ADVIL) tablet 800 mg (has no administration in time range)     The patient appears  reasonably screen and/or stabilized for discharge and I doubt any other medical condition or other [facility] requiring further screening, evaluation, or treatment in the ED at this time prior to discharge.          Final Clinical Impression(s) / ED Diagnoses Final diagnoses:  Viral respiratory illness    Rx / DC Orders ED Discharge Orders     None         Melene Plan, DO 10/02/23 2338

## 2023-10-02 NOTE — ED Triage Notes (Signed)
Patient presents with nausea, headaches, sensitivity to light, chills and sweating. Sx has gotten worse since he was seen last. Treated with OTC Nyquil and Tylenol.

## 2023-10-03 ENCOUNTER — Telehealth: Payer: Self-pay

## 2023-10-03 LAB — RESP PANEL BY RT-PCR (RSV, FLU A&B, COVID)  RVPGX2
Influenza A by PCR: NEGATIVE
Influenza B by PCR: NEGATIVE
Resp Syncytial Virus by PCR: NEGATIVE
SARS Coronavirus 2 by RT PCR: NEGATIVE

## 2023-10-03 LAB — SARS CORONAVIRUS 2 (TAT 6-24 HRS): SARS Coronavirus 2: NEGATIVE

## 2023-10-03 NOTE — Transitions of Care (Post Inpatient/ED Visit) (Signed)
   10/03/2023  Name: Brady Wells MRN: [id number] DOB: [birth year]  Today's TOC FU Call Status: Today's TOC FU Call Status:: Unsuccessful Call (1st Attempt) Unsuccessful Call (1st Attempt) Date: 10/03/23  Attempted to reach the patient regarding the most recent Inpatient/ED visit.  Follow Up Plan: Additional outreach attempts will be made to reach the patient to complete the Transitions of Care (Post Inpatient/ED visit) call.   Signature Lisabeth Devoid, [place]

## 2023-10-24 ENCOUNTER — Ambulatory Visit: Payer: BC Managed Care – PPO | Admitting: Family Medicine

## 2023-10-24 ENCOUNTER — Encounter: Payer: Self-pay | Admitting: Family Medicine

## 2023-10-24 VITALS — BP 125/85 | HR 72 | Temp 98.6°F | Ht 72.0 in | Wt 239.0 lb

## 2023-10-24 DIAGNOSIS — R7303 Prediabetes: Secondary | ICD-10-CM | POA: Diagnosis not present

## 2023-10-24 DIAGNOSIS — E669 Obesity, unspecified: Secondary | ICD-10-CM

## 2023-10-24 DIAGNOSIS — E66811 Obesity, class 1: Secondary | ICD-10-CM

## 2023-10-24 DIAGNOSIS — Z6832 Body mass index (BMI) 32.0-32.9, adult: Secondary | ICD-10-CM

## 2023-10-24 DIAGNOSIS — J302 Other seasonal allergic rhinitis: Secondary | ICD-10-CM | POA: Insufficient documentation

## 2023-10-24 DIAGNOSIS — E559 Vitamin D deficiency, unspecified: Secondary | ICD-10-CM

## 2023-10-24 MED ORDER — VITAMIN D (ERGOCALCIFEROL) 1.25 MG (50000 UNIT) PO CAPS: 50000.0000 [IU] | ORAL_CAPSULE | ORAL | 0 refills | Status: DC

## 2023-10-24 MED ORDER — QSYMIA 7.5-46 MG PO CP24: ORAL_CAPSULE | ORAL | 0 refills | Status: DC

## 2023-10-24 NOTE — Assessment & Plan Note (Signed)
Patient complains of seasonal allergies since childhood during the fall and spring.  His most recent bout of fall allergies resulted in a sinus infection requiring antibiotics and oral steroids.  He has continued to have congestion for over 1 month which has led to more fatigue and limited workouts.  He is not currently seeing an allergist.  Referral made to [facility].  He may continue using OTC nasal steroid spray.

## 2023-10-24 NOTE — Assessment & Plan Note (Signed)
Lab Results  Component Value Date   HGBA1C 6.5 (H) 09/26/2023   Reviewed labs from last visit.  His A1c did creep up to 6.5.  This was following a round of oral steroids for sinusitis.  His other A1c's have been in the prediabetic range.  He is at risk for type 2 diabetes and does have positive family history.  He also has insulin resistance.  We have discussed the addition of metformin to help with both prediabetes and insulin resistance but he declined at this point in time.  Look for A1c improvements with Qsymia, dietary changes and weight loss.

## 2023-10-24 NOTE — Assessment & Plan Note (Signed)
Last vitamin D Lab Results  Component Value Date   VD25OH 65.8 09/26/2023   He remains on vitamin D 50,000 IU once weekly.  We reviewed his lab results from last visit.  His vitamin D level has greatly improved.  He may continue his vitamin D at 50,000 IU once weekly for the next 4 weeks with plans to move him to every 14 days thereafter.

## 2023-10-24 NOTE — Progress Notes (Signed)
Office: [phone]  /  Fax: [phone]  WEIGHT SUMMARY AND BIOMETRICS  Starting Date: 12/30/22  Starting Weight: 254lb   Weight Lost Since Last Visit: 1lb   Vitals Temp: 98.6 F (37 C) BP: 125/85 Pulse Rate: 72 SpO2: 98 %   Body Composition  Body Fat %: 30.5 % Fat Mass (lbs): 72.8 lbs Muscle Mass (lbs): 158 lbs Total Body Water (lbs): 118.8 lbs Visceral Fat Rating : 14    HPI  Chief Complaint: OBESITY  Brady Wells is here to discuss his progress with his obesity treatment plan. He is on the the Category 4 Plan and states he is following his eating plan approximately 85 % of the time. He states he is exercising 60 minutes 3-4 times per week.   Interval History:  Since last office visit he is down 1 lb This gives him a net weight loss of 15 lb in the a past 9 mos He has been on a cruise and has been sick with a  sinus infection for a month He is also struggling with seasonal allergies He hasn't had as much energy to workout He started Qsymia 3 weeks ago 3.75/23 mg and hasn't felt much appetite control He denies taking any OTC cough or congestion medications He is mindful of his food choices and avoids excess sweets  Barriers to further progress include: Insulin resistance, working third shift  Pharmacotherapy: Qsymia 3.75/23 mg once daily  PHYSICAL EXAM:  Blood pressure 125/85, pulse 72, temperature 98.6 F (37 C), height 6' (1.829 m), weight 239 lb (108.4 kg), SpO2 98%. Body mass index is 32.41 kg/m.  General: He is overweight, cooperative, alert, well developed, and in no acute distress. PSYCH: Has normal mood, affect and thought process.   Lungs: Normal breathing effort, no conversational dyspnea. HEENT: Nasal congestion present  ASSESSMENT AND PLAN  TREATMENT PLAN FOR OBESITY:  Recommended Dietary Goals  Brady Wells is currently in the action stage of change. As such, his goal is to continue weight management plan. He has agreed to keeping a food  journal and adhering to recommended goals of 2200 calories and 120+ grams of protein.  Behavioral Intervention  We discussed the following Behavioral Modification Strategies today: increasing lean protein intake to established goals, increasing vegetables, increasing lower glycemic fruits, increasing water intake , work on meal planning and preparation, work on Counselling psychologist calories using tracking application, reading food labels , keeping healthy foods at home, practice mindfulness eating and understand the difference between hunger signals and cravings, work on managing stress, creating time for self-care and relaxation, celebration eating strategies, and continue to work on maintaining a reduced calorie state, getting the recommended amount of protein, incorporating whole foods, making healthy choices, staying well hydrated and practicing mindfulness when eating..  Additional resources provided today: NA  Recommended Physical Activity Goals  Brady Wells has been advised to work up to 150 minutes of moderate intensity aerobic activity a week and strengthening exercises 2-3 times per week for cardiovascular health, weight loss maintenance and preservation of muscle mass.   He has agreed to [employer] strengthening exercises with a goal of 2-3 sessions a week  -As energy level improves, resume gym workouts including weight training 2-3 times a week -try tracking daily steps to increase walking time  Pharmacotherapy changes for the treatment of obesity: Increase Qsymia to 7.5/46 mg once daily at the start of the day  ASSOCIATED CONDITIONS ADDRESSED TODAY  Seasonal allergies Assessment & Plan: Patient complains of seasonal allergies since childhood  during the fall and spring.  His most recent bout of fall allergies resulted in a sinus infection requiring antibiotics and oral steroids.  He has continued to have congestion for over 1 month which has led to more fatigue and limited workouts.  He is  not currently seeing an allergist.  [facility].  He may continue using OTC nasal steroid spray.  Orders: -     Ambulatory referral to Allergy  Generalized obesity  Vitamin D deficiency Assessment & Plan: Last vitamin D Lab Results  Component Value Date   VD25OH 65.8 09/26/2023   He remains on vitamin D 50,000 IU once weekly.  We reviewed his lab results from last visit.  His vitamin D level has greatly improved.  He may continue his vitamin D at 50,000 IU once weekly for the next 4 weeks with plans to move him to every 14 days thereafter.    Orders: -     Vitamin D (Ergocalciferol); Take 1 capsule (50,000 Units total) by mouth every 7 (seven) days.  Dispense: 5 capsule; Refill: 0  Class 1 obesity due to excess calories with body mass index (BMI) of 32.0 to 32.9 in adult, unspecified whether serious comorbidity present Assessment & Plan: Reviewed patient's overall results. Patient has a total body weight loss of 15 pounds in the past 9 months of medically supervised weight management which is a 5.9% total body weight loss. He has just darted on Qsymia 4 weeks ago. His obstacles to further progress have included significant insulin resistance and working third shift  Consider the addition of metformin for underlying insulin resistance and prediabetes. Will continue to focus on healthy lifestyle changes and tracking daily caloric intake. His obesity is complicated by metabolic sequelae of hepatic steatosis, prediabetes, insulin resistance  Orders: -     Qsymia; 1 capsule daily at the start of your day  Dispense: 30 capsule; Refill: 0  Pre-diabetes Assessment & Plan: Lab Results  Component Value Date   HGBA1C 6.5 (H) 09/26/2023   Reviewed labs from last visit.  His A1c did creep up to 6.5.  This was following a round of oral steroids for sinusitis.  His other A1c's have been in the prediabetic range.  He is at risk for type 2 diabetes and does have  positive family history.  He also has insulin resistance.  We have discussed the addition of metformin to help with both prediabetes and insulin resistance but he declined at this point in time.  Look for A1c improvements with Qsymia, dietary changes and weight loss.       He was informed of the importance of frequent follow up visits to maximize his success with intensive lifestyle modifications for his multiple health conditions.   ATTESTASTION STATEMENTS:  Reviewed by clinician on day of visit: allergies, medications, problem list, medical history, surgical history, family history, social history, and previous encounter notes pertinent to obesity diagnosis.   I have personally spent 30 minutes total time today in preparation, patient care, nutritional counseling and documentation for this visit, including the following: review of clinical lab tests; review of medical tests/procedures/services.      Glennis Brink, DO DABFM, DABOM [facility] [address] [place], [place] [address] [phone]

## 2023-10-24 NOTE — Assessment & Plan Note (Signed)
Reviewed patient's overall results. Patient has a total body weight loss of 15 pounds in the past 9 months of medically supervised weight management which is a 5.9% total body weight loss. He has just darted on Qsymia 4 weeks ago. His obstacles to further progress have included significant insulin resistance and working third shift  Consider the addition of metformin for underlying insulin resistance and prediabetes. Will continue to focus on healthy lifestyle changes and tracking daily caloric intake. His obesity is complicated by metabolic sequelae of hepatic steatosis, prediabetes, insulin resistance

## 2023-10-25 ENCOUNTER — Telehealth (INDEPENDENT_AMBULATORY_CARE_PROVIDER_SITE_OTHER): Payer: Self-pay | Admitting: Family Medicine

## 2023-10-25 NOTE — Telephone Encounter (Signed)
New message   The patient was seen yesterday and expressed why his prescription had not been called in.    1. Which medications need to be refilled? (please list name of each medication and dose if known)  Vitamin D, Ergocalciferol, (DRISDOL) 1.25 MG (50000 UNIT) CAPS capsule  Phentermine-Topiramate (QSYMIA) 7.5-46 MG CP24   4. Which pharmacy/location (including street and city if local pharmacy) is medication to be sent to? [id number] - [place], [place] - [address]    5. Do they need a 30 day or 90 day supply? 30 day supply

## 2023-10-25 NOTE — Telephone Encounter (Signed)
Left patient a message stating we did send in his medication to his pharmacy. He is to call back if there are any issues.

## 2023-11-01 ENCOUNTER — Telehealth: Payer: Self-pay | Admitting: *Deleted

## 2023-11-01 NOTE — Telephone Encounter (Signed)
Faxed over referral for patient to [facility]. They should contact patient with appointment time and date.

## 2023-11-21 ENCOUNTER — Ambulatory Visit: Payer: BC Managed Care – PPO | Admitting: Family Medicine

## 2023-11-21 ENCOUNTER — Telehealth: Payer: Self-pay | Admitting: *Deleted

## 2023-11-21 ENCOUNTER — Encounter: Payer: Self-pay | Admitting: Family Medicine

## 2023-11-21 VITALS — BP 125/78 | HR 70 | Temp 98.6°F | Ht 72.0 in | Wt 236.0 lb

## 2023-11-21 DIAGNOSIS — R7303 Prediabetes: Secondary | ICD-10-CM | POA: Diagnosis not present

## 2023-11-21 DIAGNOSIS — E66811 Obesity, class 1: Secondary | ICD-10-CM | POA: Diagnosis not present

## 2023-11-21 DIAGNOSIS — E559 Vitamin D deficiency, unspecified: Secondary | ICD-10-CM

## 2023-11-21 DIAGNOSIS — Z6832 Body mass index (BMI) 32.0-32.9, adult: Secondary | ICD-10-CM

## 2023-11-21 DIAGNOSIS — J302 Other seasonal allergic rhinitis: Secondary | ICD-10-CM

## 2023-11-21 DIAGNOSIS — E6609 Other obesity due to excess calories: Secondary | ICD-10-CM

## 2023-11-21 MED ORDER — QSYMIA 7.5-46 MG PO CP24: ORAL_CAPSULE | ORAL | 1 refills | Status: DC

## 2023-11-21 NOTE — Assessment & Plan Note (Signed)
He was referred to [facility] last visit but has not yet been scheduled He still has nasal congestion  He is staying off of decongestants while on Qsymia  Call will be made to help get him in for this visit

## 2023-11-21 NOTE — Assessment & Plan Note (Signed)
Last vitamin D Lab Results  Component Value Date   VD25OH 65.8 09/26/2023   Improved after weeks of RX vitamin D weekly Energy level has improved  Will change him to OTC vitamin D 2,000 international units  daily

## 2023-11-21 NOTE — Assessment & Plan Note (Signed)
Lab Results  Component Value Date   HGBA1C 6.5 (H) 09/26/2023   He A1c did continue to rise to 6.5 in October despite making healthy changes to his diet and losing weight He is not on metformin He is doing well with physical activity, dietary logging and use of Qsymia for medical weight management  Consider switching to a GLP-1 receptor agonist

## 2023-11-21 NOTE — Progress Notes (Signed)
Office: [phone]  /  Fax: [phone]  WEIGHT SUMMARY AND BIOMETRICS  Starting Date: 12/30/22  Starting Weight: 254lb   Weight Lost Since Last Visit: 3lb   Vitals Temp: 98.6 F (37 C) BP: 125/78 Pulse Rate: 70 SpO2: 99 %   Body Composition  Body Fat %: 29.9 % Fat Mass (lbs): 70.6 lbs Muscle Mass (lbs): 157.4 lbs Total Body Water (lbs): 116.2 lbs Visceral Fat Rating : 14   HPI  Chief Complaint: OBESITY  Brady Wells is here to discuss his progress with his obesity treatment plan. He is on the the Category 4 Plan and states he is following his eating plan approximately 80 % of the time. He states he is exercising 60 minutes 2-3 times per week.  Interval History:  Since last office visit he is down 3 lb Muscle mass is down 0.6 lb and body fat is down 2.2 lb This gives him a net weight loss of 18 lb in the past 10 mos He would like to lose about 16 more pounbs He did go up on Qsymia to 7.5/46 mg daily and is tolerating this well He is working 70 hrs/ week with less gym time He is physically active at work He is able to get meals and is planning ahead He is started to feel his weight loss He has been sleeping better He is working on hydrating better with water  Pharmacotherapy: Qsymia 7.5/46 mg daily  PHYSICAL EXAM:  Blood pressure 125/78, pulse 70, temperature 98.6 F (37 C), height 6' (1.829 m), weight 236 lb (107 kg), SpO2 99%. Body mass index is 32.01 kg/m.  General: He is overweight, cooperative, alert, well developed, and in no acute distress. PSYCH: Has normal mood, affect and thought process.   Lungs: Normal breathing effort, no conversational dyspnea.   ASSESSMENT AND PLAN  TREATMENT PLAN FOR OBESITY:  Recommended Dietary Goals  Brady Wells is currently in the action stage of change. As such, his goal is to continue weight management plan. He has agreed to keeping a food journal and adhering to recommended goals of 2200 calories and 150 g of   protein.  Behavioral Intervention  We discussed the following Behavioral Modification Strategies today: increasing lean protein intake to established goals, increasing fiber rich foods, increasing water intake , work on meal planning and preparation, work on Counselling psychologist calories using tracking application, keeping healthy foods at home, work on managing stress, creating time for self-care and relaxation, avoiding temptations and identifying enticing environmental cues, planning for success, and continue to work on maintaining a reduced calorie state, getting the recommended amount of protein, incorporating whole foods, making healthy choices, staying well hydrated and practicing mindfulness when eating..  Additional resources provided today: NA  Recommended Physical Activity Goals  Brady Wells has been advised to work up to 150 minutes of moderate intensity aerobic activity a week and strengthening exercises 2-3 times per week for cardiovascular health, weight loss maintenance and preservation of muscle mass.   He has agreed to Increase the intensity, frequency or duration of strengthening exercises   Pharmacotherapy changes for the treatment of obesity: none  ASSOCIATED CONDITIONS ADDRESSED TODAY  Seasonal allergies Assessment & Plan: He was referred to [facility] last visit but has not yet been scheduled He still has nasal congestion  He is staying off of decongestants while on Qsymia  Call will be made to help get him in for this visit   Vitamin D deficiency Assessment & Plan: Last vitamin D Lab  Results  Component Value Date   VD25OH 65.8 09/26/2023   Improved after weeks of RX vitamin D weekly Energy level has improved  Will change him to OTC vitamin D 2,000 international units  daily   Class 1 obesity due to excess calories with body mass index (BMI) of 32.0 to 32.9 in adult, unspecified whether serious comorbidity present -     Qsymia; 1 capsule daily at  the start of your day  Dispense: 30 capsule; Refill: 1  Pre-diabetes Assessment & Plan: Lab Results  Component Value Date   HGBA1C 6.5 (H) 09/26/2023   He A1c did continue to rise to 6.5 in October despite making healthy changes to his diet and losing weight He is not on metformin He is doing well with physical activity, dietary logging and use of Qsymia for medical weight management  Consider switching to a GLP-1 receptor agonist       He was informed of the importance of frequent follow up visits to maximize his success with intensive lifestyle modifications for his multiple health conditions.   ATTESTASTION STATEMENTS:  Reviewed by clinician on day of visit: allergies, medications, problem list, medical history, surgical history, family history, social history, and previous encounter notes pertinent to obesity diagnosis.   I have personally spent 30 minutes total time today in preparation, patient care, nutritional counseling and documentation for this visit, including the following: review of clinical lab tests; review of medical tests/procedures/services.      Glennis Brink, DO DABFM, DABOM [facility] [address] [place], [place] [address] [phone]

## 2023-11-21 NOTE — Telephone Encounter (Signed)
Phone call to [facility] to check on patients referral. They did receive the referral and called the patient but they have not heard back from him. Patient notified.

## 2023-11-24 ENCOUNTER — Other Ambulatory Visit: Payer: Self-pay | Admitting: Family Medicine

## 2023-11-24 DIAGNOSIS — E669 Obesity, unspecified: Secondary | ICD-10-CM

## 2023-11-28 ENCOUNTER — Other Ambulatory Visit: Payer: Self-pay

## 2023-11-28 ENCOUNTER — Telehealth (INDEPENDENT_AMBULATORY_CARE_PROVIDER_SITE_OTHER): Payer: Self-pay | Admitting: Family Medicine

## 2023-11-28 NOTE — Telephone Encounter (Signed)
Pt called in requesting that someone call him about Medication Qsymia 7.5mg . Pt reports he was told a refill would be sent but the pharmacy told him the refill request has been denied. Please follow up with patient.

## 2023-11-28 NOTE — Telephone Encounter (Signed)
Left message for patient to return call.

## 2023-11-29 ENCOUNTER — Other Ambulatory Visit: Payer: Self-pay

## 2023-11-29 MED ORDER — LEVOCETIRIZINE DIHYDROCHLORIDE 5 MG PO TABS: ORAL_TABLET | ORAL | 2 refills | Status: DC

## 2023-11-29 MED ORDER — FLUTICASONE-SALMETEROL 100-50 MCG/ACT IN AEPB: 1.0000 | INHALATION_SPRAY | Freq: Two times a day (BID) | RESPIRATORY_TRACT | 2 refills | Status: DC

## 2023-12-05 ENCOUNTER — Other Ambulatory Visit: Payer: Self-pay | Admitting: Internal Medicine

## 2023-12-09 ENCOUNTER — Other Ambulatory Visit: Payer: Self-pay | Admitting: Internal Medicine

## 2023-12-25 ENCOUNTER — Other Ambulatory Visit: Payer: Self-pay | Admitting: Internal Medicine

## 2023-12-26 ENCOUNTER — Encounter: Payer: Self-pay | Admitting: Family Medicine

## 2023-12-26 ENCOUNTER — Ambulatory Visit: Payer: BC Managed Care – PPO | Admitting: Family Medicine

## 2023-12-26 VITALS — BP 118/77 | HR 65 | Temp 98.0°F | Ht 72.0 in | Wt 238.0 lb

## 2023-12-26 DIAGNOSIS — E88819 Insulin resistance, unspecified: Secondary | ICD-10-CM | POA: Diagnosis not present

## 2023-12-26 DIAGNOSIS — Z6832 Body mass index (BMI) 32.0-32.9, adult: Secondary | ICD-10-CM

## 2023-12-26 DIAGNOSIS — E66811 Obesity, class 1: Secondary | ICD-10-CM

## 2023-12-26 DIAGNOSIS — E119 Type 2 diabetes mellitus without complications: Secondary | ICD-10-CM | POA: Diagnosis not present

## 2023-12-26 DIAGNOSIS — K760 Fatty (change of) liver, not elsewhere classified: Secondary | ICD-10-CM | POA: Diagnosis not present

## 2023-12-26 DIAGNOSIS — E1169 Type 2 diabetes mellitus with other specified complication: Secondary | ICD-10-CM | POA: Insufficient documentation

## 2023-12-26 DIAGNOSIS — Z7985 Long-term (current) use of injectable non-insulin antidiabetic drugs: Secondary | ICD-10-CM

## 2023-12-26 MED ORDER — TIRZEPATIDE 2.5 MG/0.5ML ~~LOC~~ SOAJ: 2.5000 mg | SUBCUTANEOUS | 0 refills | Status: DC

## 2023-12-26 NOTE — Assessment & Plan Note (Signed)
Lab Results  Component Value Date   HGBA1C 6.5 (H) 09/26/2023   New diagnosis.  Reviewed labs from 10/28 showing an A1c of 6.5.  His A1c has been consistently rising over the past several years.  He is unsure of a family history for type 2 diabetes.  He has actively been working on improving his diet, regular exercise and weight loss over the past 11 months of medically supervised weight management yet his A1c continues to rise.  He has never used metformin. He has had some success on Ozempic in the past with some adverse GI side effects  Begin Mounjaro at 2.5 mg once weekly injection per mL type 2 diabetes and insulin resistance Patient denies a personal or family history of pancreatitis, medullary thyroid carcinoma or multiple endocrine neoplasia type II. Recommend reviewing pen training video online.

## 2023-12-26 NOTE — Progress Notes (Signed)
Office: [phone]  /  Fax: [phone]  WEIGHT SUMMARY AND BIOMETRICS  Starting Date: 12/30/22  Starting Weight: 254lb   Weight Lost Since Last Visit: 0lb   Vitals Temp: 98 F (36.7 C) BP: 118/77 Pulse Rate: 65 SpO2: 100 %   Body Composition  Body Fat %: 29.6 % Fat Mass (lbs): 70.6 lbs Muscle Mass (lbs): 159.6 lbs Total Body Water (lbs): 116.8 lbs Visceral Fat Rating : 14     HPI  Chief Complaint: OBESITY  Brady Wells is here to discuss his progress with his obesity treatment plan. He is on the the Category 4 Plan and states he is following his eating plan approximately 85 % of the time. He states he is exercising 60 minutes 3-4 times per week.  Interval History:  Since last office visit he is up 2 lb He is up 2.2 lb of muscle mass and body fat is stable This gives him a net weight loss of 16 lb in 11 mos of medically supervised weight management This is a 6.2% total body weight loss He has felt slightly hungrier on Qsymia 7.5/46 mg each day He is working night shift He has been working out more consistently with his wife, 3 to 4 days a week doing both cardio and resistance training  Pharmacotherapy: Qsymia 7.5/46 mg daily  PHYSICAL EXAM:  Blood pressure 118/77, pulse 65, temperature 98 F (36.7 C), height 6' (1.829 m), weight 238 lb (108 kg), SpO2 100%. Body mass index is 32.28 kg/m.  General: He has an athletic build, cooperative, alert, well developed, and in no acute distress. PSYCH: Has normal mood, affect and thought process.   Lungs: Normal breathing effort, no conversational dyspnea.   ASSESSMENT AND PLAN  TREATMENT PLAN FOR OBESITY:  Recommended Dietary Goals  Brady Wells is currently in the action stage of change. As such, his goal is to continue weight management plan. He has agreed to keeping a food journal and adhering to recommended goals of 2200 calories and 150 g of protein.  Behavioral Intervention  We discussed the following  Behavioral Modification Strategies today: increasing lean protein intake to established goals, increasing fiber rich foods, increasing water intake , work on meal planning and preparation, work on tracking and journaling calories using tracking application, keeping healthy foods at home, decreasing eating out or consumption of processed foods, and making healthy choices when eating convenient foods, continue to practice mindfulness when eating, planning for success, and continue to work on maintaining a reduced calorie state, getting the recommended amount of protein, incorporating whole foods, making healthy choices, staying well hydrated and practicing mindfulness when eating..  Additional resources provided today: NA  Recommended Physical Activity Goals  Brady Wells has been advised to work up to 150 minutes of moderate intensity aerobic activity a week and strengthening exercises 2-3 times per week for cardiovascular health, weight loss maintenance and preservation of muscle mass.   He has agreed to Work on scheduling and tracking physical activity.  Plan to start tracking daily steps  Pharmacotherapy changes for the treatment of obesity: Discontinue Qsymia  ASSOCIATED CONDITIONS ADDRESSED TODAY  Type 2 diabetes mellitus without complication, without long-term current use of insulin Brady Wells) Assessment & Plan: Lab Results  Component Value Date   HGBA1C 6.5 (H) 09/26/2023   New diagnosis.  Reviewed labs from 10/28 showing an A1c of 6.5.  His A1c has been consistently rising over the past several years.  He is unsure of a family history for type 2 diabetes.  He  has actively been working on improving his diet, regular exercise and weight loss over the past 11 months of medically supervised weight management yet his A1c continues to rise.  He has never used metformin. He has had some success on Ozempic in the past with some adverse GI side effects  Begin Mounjaro at 2.5 mg once weekly injection per  mL type 2 diabetes and insulin resistance Patient denies a personal or family history of pancreatitis, medullary thyroid carcinoma or multiple endocrine neoplasia type II. Recommend reviewing pen training video online.   Orders: -     Tirzepatide; Inject 2.5 mg into the skin once a week.  Dispense: 2 mL; Refill: 0  Class 1 obesity due to excess calories with serious comorbidity and body mass index (BMI) of 32.0 to 32.9 in adult  NAFLD (nonalcoholic fatty liver disease) Assessment & Plan: Patient has metabolic sequelae of class I obesity including nonalcoholic fatty liver disease confirmed on right upper quadrant ultrasound September 2024, type 2 diabetes and insulin resistance.  He is actively working on weight reduction.  He has reduced his intake of alcohol.  He has added in more consistent exercise.  Continue to work on healthy lifestyle changes with a target BMI under 30 and a body fat percent under 25. Look for improvement in fatty liver disease using Mounjaro for type 2 diabetes treatment   Insulin resistance Assessment & Plan: Stable.  Reviewed his labs from October.  His fasting insulin remains high at 27 despite healthy lifestyle changes and weight loss.  He is doing better with diet and exercise.  He has never used metformin.  Plan to track daily steps aiming for 10,000 steps daily to help with insulin sensitivity.  Continue weight training 3 to 5 days a week.       He was informed of the importance of frequent follow up visits to maximize his success with intensive lifestyle modifications for his multiple health conditions.   ATTESTASTION STATEMENTS:  Reviewed by clinician on day of visit: allergies, medications, problem list, medical history, surgical history, family history, social history, and previous encounter notes pertinent to obesity diagnosis.   I have personally spent 30 minutes total time today in preparation, patient care, nutritional counseling and  documentation for this visit, including the following: review of clinical lab tests; review of medical tests/procedures/services.      Glennis Brink, DO DABFM, DABOM [facility] [name] [place], [place] [address] [phone]

## 2023-12-26 NOTE — Assessment & Plan Note (Signed)
Patient has metabolic sequelae of class I obesity including nonalcoholic fatty liver disease confirmed on right upper quadrant ultrasound September 2024, type 2 diabetes and insulin resistance.  He is actively working on weight reduction.  He has reduced his intake of alcohol.  He has added in more consistent exercise.  Continue to work on healthy lifestyle changes with a target BMI under 30 and a body fat percent under 25. Look for improvement in fatty liver disease using Mounjaro for type 2 diabetes treatment

## 2023-12-26 NOTE — Assessment & Plan Note (Signed)
Stable.  Reviewed his labs from October.  His fasting insulin remains high at 27 despite healthy lifestyle changes and weight loss.  He is doing better with diet and exercise.  He has never used metformin.  Plan to track daily steps aiming for 10,000 steps daily to help with insulin sensitivity.  Continue weight training 3 to 5 days a week.

## 2023-12-27 ENCOUNTER — Telehealth: Payer: Self-pay | Admitting: Family Medicine

## 2023-12-27 DIAGNOSIS — E119 Type 2 diabetes mellitus without complications: Secondary | ICD-10-CM

## 2023-12-27 MED ORDER — TIRZEPATIDE 2.5 MG/0.5ML ~~LOC~~ SOAJ: 2.5000 mg | SUBCUTANEOUS | 0 refills | Status: DC

## 2023-12-27 NOTE — Telephone Encounter (Signed)
I changed it to a 3 mos supply and resent RX for [facility] today.  It doesn't make much sense that insurance required this since it is a ramping dose, but I'd like him to get started.

## 2023-12-27 NOTE — Telephone Encounter (Signed)
Patient stated he needs a 90-day supply of the medication prescribed yesterday according to his insurance.

## 2023-12-27 NOTE — Telephone Encounter (Signed)
Left patient a message to let him know we sent in the 3 month supply of his medication. He is to call back if he has any problems.

## 2024-01-20 ENCOUNTER — Other Ambulatory Visit: Payer: Self-pay | Admitting: Internal Medicine

## 2024-01-30 ENCOUNTER — Encounter: Payer: Self-pay | Admitting: Family Medicine

## 2024-01-30 ENCOUNTER — Ambulatory Visit: Payer: BC Managed Care – PPO | Admitting: Family Medicine

## 2024-01-30 VITALS — BP 118/77 | HR 63 | Temp 98.6°F | Ht 72.0 in | Wt 240.0 lb

## 2024-01-30 DIAGNOSIS — Z6832 Body mass index (BMI) 32.0-32.9, adult: Secondary | ICD-10-CM

## 2024-01-30 DIAGNOSIS — G4726 Circadian rhythm sleep disorder, shift work type: Secondary | ICD-10-CM

## 2024-01-30 DIAGNOSIS — E119 Type 2 diabetes mellitus without complications: Secondary | ICD-10-CM

## 2024-01-30 DIAGNOSIS — K760 Fatty (change of) liver, not elsewhere classified: Secondary | ICD-10-CM

## 2024-01-30 DIAGNOSIS — E66811 Obesity, class 1: Secondary | ICD-10-CM | POA: Diagnosis not present

## 2024-01-30 DIAGNOSIS — Z7985 Long-term (current) use of injectable non-insulin antidiabetic drugs: Secondary | ICD-10-CM

## 2024-01-30 MED ORDER — TIRZEPATIDE 5 MG/0.5ML ~~LOC~~ SOAJ: 5.0000 mg | SUBCUTANEOUS | 0 refills | Status: DC

## 2024-01-30 NOTE — Progress Notes (Signed)
 Office: [phone]  /  Fax: [phone]  WEIGHT SUMMARY AND BIOMETRICS  Starting Date: 12/30/22  Starting Weight: 254lb   Weight Lost Since Last Visit: 0lb   Vitals Temp: 98.6 F (37 C) BP: 118/77 Pulse Rate: 63 SpO2: 98 %   Body Composition  Body Fat %: 29.4 % Fat Mass (lbs): 70.8 lbs Muscle Mass (lbs): 161.6 lbs Total Body Water (lbs): 116.4 lbs Visceral Fat Rating : 14   HPI  Chief Complaint: OBESITY  Brady Wells is here to discuss his progress with his obesity treatment plan. He is on the the Category 4 Plan and states he is following his eating plan approximately 85-90 % of the time. He states he is exercising 60 minutes 3 times per week.  Interval History:  Since last office visit he is up 2 lb He gained 2 lb of muscle mass and gained 0.2 lb of body fat He started on Mounjaro 2.5 mg with little change in appetite He is tolerating new start of Mounjaro well He had slight nausea after the first dose of Mounjaro He is avoiding sweets He plans to add in running/ walking He is weight training 2-3 x a week He is balancing his work schedule with working nights and 2 young kids He has a net weight loss of 14 lb in 1 year  Pharmacotherapy: Mounjaro 2.5 mg weekly  PHYSICAL EXAM:  Blood pressure 118/77, pulse 63, temperature 98.6 F (37 C), height 6' (1.829 m), weight 240 lb (108.9 kg), SpO2 98%. Body mass index is 32.55 kg/m.  General: He is athletic appearing,  cooperative, alert, well developed, and in no acute distress. PSYCH: Has normal mood, affect and thought process.   Lungs: Normal breathing effort, no conversational dyspnea.   ASSESSMENT AND PLAN  TREATMENT PLAN FOR OBESITY:  Recommended Dietary Goals  Brady Wells is currently in the action stage of change. As such, his goal is to continue weight management plan. He has agreed to following a lower carbohydrate, vegetable and lean protein rich diet plan. 2200 cal/ day including ~150 g of protein  daily  Behavioral Intervention  We discussed the following Behavioral Modification Strategies today: increasing lean protein intake to established goals, increasing fiber rich foods, increasing water intake , work on meal planning and preparation, keeping healthy foods at home, decreasing eating out or consumption of processed foods, and making healthy choices when eating convenient foods, practice mindfulness eating and understand the difference between hunger signals and cravings, work on managing stress, creating time for self-care and relaxation, avoiding temptations and identifying enticing environmental cues, planning for success, and continue to work on maintaining a reduced calorie state, getting the recommended amount of protein, incorporating whole foods, making healthy choices, staying well hydrated and practicing mindfulness when eating..  Additional resources provided today: NA  Recommended Physical Activity Goals  Brady Wells has been advised to work up to 150 minutes of moderate intensity aerobic activity a week and strengthening exercises 2-3 times per week for cardiovascular health, weight loss maintenance and preservation of muscle mass.   He has agreed to Work on scheduling and tracking physical activity.  Increase cardio to 30 min 3-4 x a week  Pharmacotherapy changes for the treatment of obesity: increase Mounjaro to 5 mg weekly  ASSOCIATED CONDITIONS ADDRESSED TODAY  Type 2 diabetes mellitus without complication, without long-term current use of insulin (HCC) Lab Results  Component Value Date   HGBA1C 6.5 (H) 09/26/2023   Tolerating Mounjaro well without adverse SE Feeling little improvement  in satiety Working on a low sugar/ low starch diet rich in lean protein and fiber Look for A1c improvements in the next 1-2 months Aim for a VFR <10 and a % body fat <25%  -     Tirzepatide; Inject 5 mg into the skin once a week.  Dispense: 2 mL; Refill: 0  Class 1 obesity due to  excess calories with serious comorbidity and body mass index (BMI) of 32.0 to 32.9 in adult  NAFLD (nonalcoholic fatty liver disease) Actively working on weight reduction for the treatment of NAFLD  Shift work sleep disorder unchanged     He was informed of the importance of frequent follow up visits to maximize his success with intensive lifestyle modifications for his multiple health conditions.   ATTESTASTION STATEMENTS:  Reviewed by clinician on day of visit: allergies, medications, problem list, medical history, surgical history, family history, social history, and previous encounter notes pertinent to obesity diagnosis.   I have personally spent 30 minutes total time today in preparation, patient care, nutritional counseling and documentation for this visit, including the following: review of clinical lab tests; review of medical tests/procedures/services.      Glennis Brink, DO DABFM, DABOM [facility] [address] [place], [place] [address] [phone]

## 2024-02-15 ENCOUNTER — Emergency Department (HOSPITAL_COMMUNITY): Payer: Worker's Compensation

## 2024-02-15 ENCOUNTER — Emergency Department (HOSPITAL_COMMUNITY)
Admission: EM | Admit: 2024-02-15 | Discharge: 2024-02-15 | Disposition: A | Discharge status: Home / Self Care | Payer: Worker's Compensation | Attending: Emergency Medicine | Admitting: Emergency Medicine

## 2024-02-15 ENCOUNTER — Other Ambulatory Visit: Payer: Self-pay

## 2024-02-15 DIAGNOSIS — J45909 Unspecified asthma, uncomplicated: Secondary | ICD-10-CM | POA: Diagnosis not present

## 2024-02-15 DIAGNOSIS — Z79899 Other long term (current) drug therapy: Secondary | ICD-10-CM | POA: Diagnosis not present

## 2024-02-15 DIAGNOSIS — H538 Other visual disturbances: Secondary | ICD-10-CM | POA: Diagnosis not present

## 2024-02-15 DIAGNOSIS — Y92410 Unspecified street and highway as the place of occurrence of the external cause: Secondary | ICD-10-CM | POA: Insufficient documentation

## 2024-02-15 DIAGNOSIS — T148XXA Other injury of unspecified body region, initial encounter: Secondary | ICD-10-CM | POA: Insufficient documentation

## 2024-02-15 DIAGNOSIS — M545 Low back pain, unspecified: Secondary | ICD-10-CM | POA: Insufficient documentation

## 2024-02-15 DIAGNOSIS — M542 Cervicalgia: Secondary | ICD-10-CM | POA: Diagnosis not present

## 2024-02-15 MED ORDER — CYCLOBENZAPRINE HCL 10 MG PO TABS: 10.0000 mg | ORAL_TABLET | Freq: Two times a day (BID) | ORAL | 0 refills | Status: AC | PRN

## 2024-02-15 MED ORDER — ACETAMINOPHEN 500 MG PO TABS
1000.0000 mg | ORAL_TABLET | Freq: Once | ORAL | Status: AC
  Administered 2024-02-15: 1000 mg via ORAL
  Filled 2024-02-15: qty 2

## 2024-02-15 MED ORDER — IBUPROFEN 600 MG PO TABS: 600.0000 mg | ORAL_TABLET | Freq: Four times a day (QID) | ORAL | 0 refills | Status: AC | PRN
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
## 2024-02-22 ENCOUNTER — Other Ambulatory Visit: Payer: Self-pay | Admitting: Occupational Medicine

## 2024-02-22 ENCOUNTER — Ambulatory Visit
Admission: RE | Admit: 2024-02-22 | Discharge: 2024-02-22 | Disposition: A | Discharge status: Home / Self Care | Payer: Worker's Compensation | Source: Ambulatory Visit | Attending: Occupational Medicine | Admitting: Occupational Medicine

## 2024-02-22 ENCOUNTER — Encounter: Payer: Self-pay | Admitting: Occupational Medicine

## 2024-03-05 ENCOUNTER — Ambulatory Visit: Payer: Self-pay | Admitting: Family Medicine

## 2024-03-05 ENCOUNTER — Encounter: Payer: Self-pay | Admitting: Family Medicine

## 2024-03-05 VITALS — BP 118/79 | HR 78 | Temp 98.0°F | Ht 72.0 in | Wt 239.0 lb

## 2024-03-05 DIAGNOSIS — G4726 Circadian rhythm sleep disorder, shift work type: Secondary | ICD-10-CM

## 2024-03-05 DIAGNOSIS — E66811 Obesity, class 1: Secondary | ICD-10-CM | POA: Diagnosis not present

## 2024-03-05 DIAGNOSIS — E119 Type 2 diabetes mellitus without complications: Secondary | ICD-10-CM

## 2024-03-05 DIAGNOSIS — Z7985 Long-term (current) use of injectable non-insulin antidiabetic drugs: Secondary | ICD-10-CM

## 2024-03-05 DIAGNOSIS — K760 Fatty (change of) liver, not elsewhere classified: Secondary | ICD-10-CM

## 2024-03-05 DIAGNOSIS — E6609 Other obesity due to excess calories: Secondary | ICD-10-CM

## 2024-03-05 DIAGNOSIS — Z6832 Body mass index (BMI) 32.0-32.9, adult: Secondary | ICD-10-CM

## 2024-03-05 MED ORDER — TIRZEPATIDE 7.5 MG/0.5ML ~~LOC~~ SOAJ: 7.5000 mg | SUBCUTANEOUS | 1 refills | Status: DC
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
## 2024-03-05 NOTE — Patient Instructions (Addendum)
 Go up on Mounjaro to 7.5 mg weekly  Keep up walking with a goal of 40-60 min 4-5 days/ wk  Track daily calorie intake: Aim for 2000- 2200 cal/ day This should include 110-150 g of protein daily Don't add workouts into the App  Repeat labs next visit (fasting)

## 2024-03-19 ENCOUNTER — Ambulatory Visit: Payer: BC Managed Care – PPO | Admitting: Internal Medicine

## 2024-03-22 ENCOUNTER — Other Ambulatory Visit: Payer: Self-pay | Admitting: Internal Medicine

## 2024-03-26 ENCOUNTER — Ambulatory Visit: Admitting: Internal Medicine

## 2024-03-26 ENCOUNTER — Encounter: Payer: Self-pay | Admitting: Internal Medicine

## 2024-03-26 VITALS — BP 118/78 | HR 74 | Temp 98.4°F | Ht 72.0 in | Wt 246.8 lb

## 2024-03-26 DIAGNOSIS — E669 Obesity, unspecified: Secondary | ICD-10-CM | POA: Diagnosis not present

## 2024-03-26 DIAGNOSIS — J4520 Mild intermittent asthma, uncomplicated: Secondary | ICD-10-CM

## 2024-03-26 DIAGNOSIS — K760 Fatty (change of) liver, not elsewhere classified: Secondary | ICD-10-CM

## 2024-03-26 DIAGNOSIS — M545 Low back pain, unspecified: Secondary | ICD-10-CM

## 2024-03-26 DIAGNOSIS — Z6833 Body mass index (BMI) 33.0-33.9, adult: Secondary | ICD-10-CM

## 2024-03-26 DIAGNOSIS — E1169 Type 2 diabetes mellitus with other specified complication: Secondary | ICD-10-CM

## 2024-03-26 DIAGNOSIS — Z91030 Bee allergy status: Secondary | ICD-10-CM

## 2024-03-26 MED ORDER — FLUTICASONE-SALMETEROL 100-50 MCG/ACT IN AEPB: 1.0000 | INHALATION_SPRAY | Freq: Two times a day (BID) | RESPIRATORY_TRACT | 3 refills | Status: AC

## 2024-03-26 MED ORDER — LEVOCETIRIZINE DIHYDROCHLORIDE 5 MG PO TABS: ORAL_TABLET | ORAL | 2 refills | Status: AC

## 2024-03-26 MED ORDER — EPINEPHRINE 0.3 MG/0.3ML IJ SOAJ: INTRAMUSCULAR | 1 refills | Status: DC

## 2024-03-26 MED ORDER — ALBUTEROL SULFATE HFA 108 (90 BASE) MCG/ACT IN AERS: INHALATION_SPRAY | RESPIRATORY_TRACT | 3 refills | Status: DC
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
## 2024-03-26 NOTE — Patient Instructions (Signed)
 Asthma, Adult  Asthma is a condition that causes swelling and narrowing of the airways. These are the passages that lead from the nose and mouth down into the lungs. When asthma symptoms get worse it is called an asthma attack or flare. This can make it hard to breathe. Asthma flares can range from minor to life-threatening. There is no cure for asthma, but medicines and lifestyle changes can help to control it. What are the causes? It is not known exactly what causes asthma, but certain things can cause asthma symptoms to get worse (triggers). What can trigger an asthma attack? Cigarette smoke. Mold. Dust. Your pet's skin flakes (dander). Cockroaches. Pollen. Air pollution (like household cleaners, wood smoke, smog, or Therapist, occupational). What are the signs or symptoms? Trouble breathing (shortness of breath). Coughing. Making high-pitched whistling sounds when you breathe, most often when you breathe out (wheezing). Chest tightness. Tiredness with little activity. Poor exercise tolerance. How is this treated? Controller medicines that help prevent asthma symptoms. Fast-acting reliever or rescue medicines. These give short-term relief of asthma symptoms. Allergy medicines if your attacks are brought on by allergens. Medicines to help control the body's defense (immune) system. Staying away from the things that cause asthma attacks. Follow these instructions at home: Avoiding triggers in your home Do not allow anyone to smoke in your home. Limit use of fireplaces and wood stoves. Get rid of pests (such as roaches and mice) and their droppings. Keep your home clean. Clean your floors. Dust regularly. Use cleaning products that do not smell. Wash bed sheets and blankets every week in hot water. Dry them in a dryer. Have someone vacuum when you are not home. Change your heating and air conditioning filters often. Use blankets that are made of polyester or cotton. General  instructions Take over-the-counter and prescription medicines only as told by your doctor. Do not smoke or use any products that contain nicotine or tobacco. If you need help quitting, ask your doctor. Stay away from secondhand smoke. Avoid doing things outdoors when allergen counts are high and when air quality is low. Warm up before you exercise. Take time to cool down after exercise. Use a peak flow meter as told by your doctor. A peak flow meter is a tool that measures how well your lungs are working. Keep track of the peak flow meter's readings. Write them down. Follow your asthma action plan. This is a written plan for taking care of your asthma and treating your attacks. Make sure you get all the shots (vaccines) that your doctor recommends. Ask your doctor about a flu shot and a pneumonia shot. Keep all follow-up visits. Contact a doctor if: You have wheezing, shortness of breath, or a cough even while taking medicine to prevent attacks. The mucus you cough up (sputum) is thicker than usual. The mucus you cough up changes from clear or white to yellow, green, gray, or is bloody. You have problems from the medicine you are taking, such as: A rash. Itching. Swelling. Trouble breathing. You need reliever medicines more than 2-3 times a week. Your peak flow reading is still at 50-79% of your personal best after following the action plan for 1 hour. You have a fever. Get help right away if: You seem to be worse and are not responding to medicine during an asthma attack. You are short of breath even at rest. You get short of breath when doing very little activity. You have trouble eating, drinking, or talking. You have chest  pain or tightness. You have a fast heartbeat. Your lips or fingernails start to turn blue. You are light-headed or dizzy, or you faint. Your peak flow is less than 50% of your personal best. You feel too tired to breathe normally. These symptoms may be an  emergency. Get help right away. Call 911. Do not wait to see if the symptoms will go away. Do not drive yourself to the hospital. Summary Asthma is a long-term (chronic) condition in which the airways get tight and narrow. An asthma attack can make it hard to breathe. Asthma cannot be cured, but medicines and lifestyle changes can help control it. Make sure you understand how to avoid triggers and how and when to use your medicines. Avoid things that can cause allergy symptoms (allergens). These include animal skin flakes (dander) and pollen from trees or grass. Avoid things that pollute the air. These may include household cleaners, wood smoke, smog, or chemical odors. This information is not intended to replace advice given to you by your health care provider. Make sure you discuss any questions you have with your health care provider. Document Revised: 08/24/2021 Document Reviewed: 08/24/2021 Elsevier Patient Education  2024 [employer].

## 2024-03-27 ENCOUNTER — Encounter: Payer: Self-pay | Admitting: Internal Medicine

## 2024-03-27 LAB — CMP14+EGFR
ALT: 33 IU/L (ref 0–44)
AST: 30 IU/L (ref 0–40)
Albumin: 4.6 g/dL (ref 4.1–5.1)
Alkaline Phosphatase: 57 IU/L (ref 44–121)
BUN/Creatinine Ratio: 13 (ref 9–20)
BUN: 14 mg/dL (ref 6–24)
Bilirubin Total: 0.6 mg/dL (ref 0.0–1.2)
CO2: 22 mmol/L (ref 20–29)
Calcium: 9.5 mg/dL (ref 8.7–10.2)
Chloride: 101 mmol/L (ref 96–106)
Creatinine, Ser: 1.06 mg/dL (ref 0.76–1.27)
Globulin, Total: 2.7 g/dL (ref 1.5–4.5)
Glucose: 80 mg/dL (ref 70–99)
Potassium: 4.1 mmol/L (ref 3.5–5.2)
Sodium: 139 mmol/L (ref 134–144)
Total Protein: 7.3 g/dL (ref 6.0–8.5)
eGFR: 90 mL/min/{1.73_m2} (ref >59)

## 2024-03-27 LAB — HEMOGLOBIN A1C
Est. average glucose Bld gHb Est-mCnc: 128 mg/dL
Hgb A1c MFr Bld: 6.1 % — ABNORMAL HIGH (ref 4.8–5.6)

## 2024-03-27 LAB — TSH: TSH: 1.47 u[IU]/mL (ref 0.450–4.500)

## 2024-03-28 LAB — MICROALBUMIN / CREATININE URINE RATIO
Creatinine, Urine: 102.1 mg/dL
Microalb/Creat Ratio: 12 mg/g{creat} (ref 0–29)
Microalbumin, Urine: 12 ug/mL

## 2024-03-30 DIAGNOSIS — M545 Low back pain, unspecified: Secondary | ICD-10-CM | POA: Insufficient documentation

## 2024-03-30 DIAGNOSIS — Z91030 Bee allergy status: Secondary | ICD-10-CM | POA: Insufficient documentation

## 2024-03-30 NOTE — Assessment & Plan Note (Signed)
 Type 2 diabetes with elevated A1c. On Mounjaro , no significant appetite change, possibly due to low dosage. Under weight loss clinic care. - Order A1c test. - Continue Mounjaro . - Coordinate with weight loss clinic for ongoing management.

## 2024-03-30 NOTE — Assessment & Plan Note (Addendum)
 Asthma managed with albuterol  and Advair. Prescription issues with pharmacy for 90-day supply. - Refill albuterol  inhaler and Advair with 90-day supply. - Ensure pharmacy processes 90-day supply correctly. - Encouraged to avoid known triggers.

## 2024-03-30 NOTE — Assessment & Plan Note (Signed)
 Chronic, initially visualized on RUQ u/s September 2024. He is encouraged to decrease intake of sugary  beverages, and processed meats including bacon, sausages and deli meats. Encouraged to aim for at least 150 minutes of exercise per week.

## 2024-03-30 NOTE — Assessment & Plan Note (Signed)
 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 Pharmacy issues with 90-day supply. - Refill EpiPen  with 90-day supply. - Ensure pharmacy processes 90-day supply correctly.

## 2024-03-30 NOTE — Assessment & Plan Note (Addendum)
 He is now followed by [facility]. He is encouraged to include strength training into his regular exercise routine. Their input is appreciated.
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
## 2024-04-02 ENCOUNTER — Ambulatory Visit: Admitting: Family Medicine

## 2024-04-02 ENCOUNTER — Encounter: Payer: Self-pay | Admitting: Family Medicine

## 2024-04-02 VITALS — BP 126/78 | HR 75 | Temp 98.6°F | Ht 72.0 in | Wt 240.0 lb

## 2024-04-02 DIAGNOSIS — E66811 Obesity, class 1: Secondary | ICD-10-CM | POA: Diagnosis not present

## 2024-04-02 DIAGNOSIS — G4720 Circadian rhythm sleep disorder, unspecified type: Secondary | ICD-10-CM

## 2024-04-02 DIAGNOSIS — M5442 Lumbago with sciatica, left side: Secondary | ICD-10-CM | POA: Diagnosis not present

## 2024-04-02 DIAGNOSIS — E119 Type 2 diabetes mellitus without complications: Secondary | ICD-10-CM | POA: Diagnosis not present

## 2024-04-02 DIAGNOSIS — Z7985 Long-term (current) use of injectable non-insulin antidiabetic drugs: Secondary | ICD-10-CM

## 2024-04-02 DIAGNOSIS — Z6832 Body mass index (BMI) 32.0-32.9, adult: Secondary | ICD-10-CM

## 2024-04-02 DIAGNOSIS — E6609 Other obesity due to excess calories: Secondary | ICD-10-CM

## 2024-04-02 MED ORDER — TIRZEPATIDE 7.5 MG/0.5ML ~~LOC~~ SOAJ: 7.5000 mg | SUBCUTANEOUS | 2 refills | Status: DC
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
## 2024-04-02 NOTE — Patient Instructions (Addendum)
 Continue Mounjaro  7.5 mg weekly  Try to track calorie intake with a goal of 2200 cal / day This should include 150 g of protein daily  Avoid excess snacks while home from work [employer] well with water and other sugar free drink  Aim for 8 hrs of sleep  Continue PT, light stretching, short walks as tolerated

## 2024-06-04 ENCOUNTER — Ambulatory Visit: Admitting: Family Medicine

## 2024-06-04 ENCOUNTER — Encounter: Payer: Self-pay | Admitting: Family Medicine

## 2024-06-04 VITALS — BP 120/80 | HR 65 | Temp 98.0°F | Ht 72.0 in | Wt 241.0 lb

## 2024-06-04 DIAGNOSIS — E6609 Other obesity due to excess calories: Secondary | ICD-10-CM

## 2024-06-04 DIAGNOSIS — E66811 Obesity, class 1: Secondary | ICD-10-CM | POA: Diagnosis not present

## 2024-06-04 DIAGNOSIS — G8929 Other chronic pain: Secondary | ICD-10-CM | POA: Diagnosis not present

## 2024-06-04 DIAGNOSIS — M545 Low back pain, unspecified: Secondary | ICD-10-CM

## 2024-06-04 DIAGNOSIS — Z7985 Long-term (current) use of injectable non-insulin antidiabetic drugs: Secondary | ICD-10-CM

## 2024-06-04 DIAGNOSIS — E119 Type 2 diabetes mellitus without complications: Secondary | ICD-10-CM | POA: Diagnosis not present

## 2024-06-04 DIAGNOSIS — Z6832 Body mass index (BMI) 32.0-32.9, adult: Secondary | ICD-10-CM

## 2024-06-04 MED ORDER — TIRZEPATIDE 10 MG/0.5ML ~~LOC~~ SOAJ: 10.0000 mg | SUBCUTANEOUS | 1 refills | Status: DC
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
## 2024-07-16 ENCOUNTER — Ambulatory Visit: Admitting: Family Medicine

## 2024-07-16 ENCOUNTER — Encounter: Payer: Self-pay | Admitting: Family Medicine

## 2024-07-16 VITALS — BP 129/71 | HR 74 | Temp 98.6°F | Ht 72.0 in | Wt 230.0 lb

## 2024-07-16 DIAGNOSIS — E88819 Insulin resistance, unspecified: Secondary | ICD-10-CM

## 2024-07-16 DIAGNOSIS — Z6831 Body mass index (BMI) 31.0-31.9, adult: Secondary | ICD-10-CM

## 2024-07-16 DIAGNOSIS — E119 Type 2 diabetes mellitus without complications: Secondary | ICD-10-CM

## 2024-07-16 DIAGNOSIS — E66811 Obesity, class 1: Secondary | ICD-10-CM

## 2024-07-16 DIAGNOSIS — Z7985 Long-term (current) use of injectable non-insulin antidiabetic drugs: Secondary | ICD-10-CM

## 2024-07-16 DIAGNOSIS — K760 Fatty (change of) liver, not elsewhere classified: Secondary | ICD-10-CM | POA: Diagnosis not present

## 2024-07-16 MED ORDER — TIRZEPATIDE 10 MG/0.5ML ~~LOC~~ SOAJ
10.0000 mg | SUBCUTANEOUS | 1 refills | Status: DC
Start: 2024-07-16 — End: 2024-08-27
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
## 2024-07-17 LAB — COMPREHENSIVE METABOLIC PANEL WITH GFR
ALT: 28 IU/L (ref 0–44)
AST: 31 IU/L (ref 0–40)
Albumin: 4.7 g/dL (ref 4.1–5.1)
Alkaline Phosphatase: 59 IU/L (ref 44–121)
BUN/Creatinine Ratio: 20 (ref 9–20)
BUN: 23 mg/dL (ref 6–24)
Bilirubin Total: 0.7 mg/dL (ref 0.0–1.2)
CO2: 21 mmol/L (ref 20–29)
Calcium: 9.3 mg/dL (ref 8.7–10.2)
Chloride: 102 mmol/L (ref 96–106)
Creatinine, Ser: 1.13 mg/dL (ref 0.76–1.27)
Globulin, Total: 2.7 g/dL (ref 1.5–4.5)
Glucose: 83 mg/dL (ref 70–99)
Potassium: 4 mmol/L (ref 3.5–5.2)
Sodium: 138 mmol/L (ref 134–144)
Total Protein: 7.4 g/dL (ref 6.0–8.5)
eGFR: 83 mL/min/1.73 (ref >59)

## 2024-07-17 LAB — HEMOGLOBIN A1C
Est. average glucose Bld gHb Est-mCnc: 117 mg/dL
Hgb A1c MFr Bld: 5.7 % — ABNORMAL HIGH (ref 4.8–5.6)

## 2024-07-17 LAB — INSULIN, RANDOM: INSULIN: 13.4 u[IU]/mL (ref 2.6–24.9)

## 2024-07-23 ENCOUNTER — Ambulatory Visit: Payer: Self-pay | Admitting: Family Medicine

## 2024-08-27 ENCOUNTER — Encounter: Payer: Self-pay | Admitting: Family Medicine

## 2024-08-27 ENCOUNTER — Ambulatory Visit: Admitting: Family Medicine

## 2024-08-27 VITALS — BP 121/76 | HR 71 | Temp 98.0°F | Ht 72.0 in | Wt 217.0 lb

## 2024-08-27 DIAGNOSIS — E663 Overweight: Secondary | ICD-10-CM | POA: Diagnosis not present

## 2024-08-27 DIAGNOSIS — K760 Fatty (change of) liver, not elsewhere classified: Secondary | ICD-10-CM

## 2024-08-27 DIAGNOSIS — Z6829 Body mass index (BMI) 29.0-29.9, adult: Secondary | ICD-10-CM

## 2024-08-27 DIAGNOSIS — E88819 Insulin resistance, unspecified: Secondary | ICD-10-CM

## 2024-08-27 DIAGNOSIS — E119 Type 2 diabetes mellitus without complications: Secondary | ICD-10-CM | POA: Diagnosis not present

## 2024-08-27 DIAGNOSIS — Z7985 Long-term (current) use of injectable non-insulin antidiabetic drugs: Secondary | ICD-10-CM

## 2024-08-27 MED ORDER — TIRZEPATIDE 10 MG/0.5ML ~~LOC~~ SOAJ
10.0000 mg | SUBCUTANEOUS | 1 refills | Status: DC
Start: 2024-08-27 — End: 2024-10-15

## 2024-08-27 NOTE — Progress Notes (Signed)
 Office: [phone]  /  Fax: [phone]  WEIGHT SUMMARY AND BIOMETRICS  Starting Date: 12/30/22  Starting Weight: 254lb   Weight Lost Since Last Visit: 13lb   Vitals Temp: 98 F (36.7 C) BP: 121/76 Pulse Rate: 71 SpO2: 99 %   Body Composition  Body Fat %: 26.3 % Fat Mass (lbs): 57.2 lbs Muscle Mass (lbs): 152.6 lbs Total Body Water (lbs): 107.8 lbs Visceral Fat Rating : 11    HPI  Chief Complaint: OBESITY  Brady Wells is here to discuss his progress with his obesity treatment plan. He is on the the Category 4 Plan and states he is following his eating plan approximately 85 % of the time. He states he is exercising 60 minutes 3 times per week.  Interval History:  Since last office visit he is down 13 lb He is down 5.2 lb of muscle mass and is down 7 lb of body fat since last visit This gives him a net weight loss of 37 lb in 1.5 years.   This is a 14.5% total body weight loss He is doing mostly cardio, completed PT and he is back to work  He is getting in lean protein with meals, not snacking as much since going back to work He is working on his fiber and water intake He is working on improving sleep- back to working nights He plans to ease back into weight training He has been walking for 1 hour 3 days a week  Pharmacotherapy: Mounjaro  10 mg once weekly   PHYSICAL EXAM:  Blood pressure 121/76, pulse 71, temperature 98 F (36.7 C), height 6' (1.829 m), weight 217 lb (98.4 kg), SpO2 99%. Body mass index is 29.43 kg/m.  General: He is healthy appearing, cooperative, alert, well developed, and in no acute distress. PSYCH: Has normal mood, affect and thought process.   Lungs: Normal breathing effort, no conversational dyspnea.   ASSESSMENT AND PLAN  TREATMENT PLAN FOR OBESITY:  Recommended Dietary Goals  Brady Wells is currently in the action stage of change. As such, his goal is to continue weight management plan. He has agreed to keeping a food journal and  adhering to recommended goals of 2000 calories and 150 g of protein and practicing portion control and making smarter food choices, such as increasing vegetables and decreasing simple carbohydrates.  Behavioral Intervention  We discussed the following Behavioral Modification Strategies today: increasing lean protein intake to established goals, increasing vegetables, increasing water intake , work on meal planning and preparation, work on tracking and journaling calories using tracking application, keeping healthy foods at home, work on managing stress, creating time for self-care and relaxation, and avoiding temptations and identifying enticing environmental cues.  Additional resources provided today: NA  Recommended Physical Activity Goals  Brady Wells has been advised to work up to 150 minutes of moderate intensity aerobic activity a week and strengthening exercises 2-3 times per week for cardiovascular health, weight loss maintenance and preservation of muscle mass.   He has agreed to [employer] strengthening exercises with a goal of 2-3 sessions a week  and Increase the intensity, frequency or duration of aerobic exercises    Pharmacotherapy changes for the treatment of obesity: None  ASSOCIATED CONDITIONS ADDRESSED TODAY  Type 2 diabetes mellitus without complication, without long-term current use of insulin  (HCC) Lab Results  Component Value Date   HGBA1C 5.7 (H) 07/16/2024  Improving Reviewed labs with patient from last visit.  His A1c is down to 5.7, previously 6.1 with a peak of  6.5 11 months ago.  Doing well on Mounjaro  without adverse side effects or meal skipping.  Actively working on weight loss, dietary change and regular exercise.  Continue Mounjaro  at 10 mg once weekly injection with plans to taper down over the next 3 to 6 months -     Tirzepatide ; Inject 10 mg into the skin once a week.  Dispense: 2 mL; Refill: 1  NAFLD (nonalcoholic fatty liver disease) Doing well with body fat  reduction and reduction of visceral fat rating.  LFTs have normalized.  Reviewed labs from last visit.  Continue working on maintenance of healthier body weight. -     Tirzepatide ; Inject 10 mg into the skin once a week.  Dispense: 2 mL; Refill: 1  Overweight (BMI 25.0-29.9)  BMI 29.0-29.9,adult  Insulin  resistance Improving.  Reviewed labs from last visit. Fasting insulin  has improved to 13.4 from 27.3.  Continue to work on reducing added sugar, refined carbohydrates and continue walking 30 to 60 minutes 3 days a week, adding back and resistance training   He was informed of the importance of frequent follow up visits to maximize his success with intensive lifestyle modifications for his multiple health conditions.   ATTESTASTION STATEMENTS:  Reviewed by clinician on day of visit: allergies, medications, problem list, medical history, surgical history, family history, social history, and previous encounter notes pertinent to obesity diagnosis.   I have personally spent 30 minutes total time today in preparation, patient care, nutritional counseling and education,  and documentation for this visit, including the following: review of most recent clinical lab tests, prescribing medications/ refilling medications, reviewing medical assistant documentation, review and interpretation of bioimpedence results.     Brady Wells, D.O. DABFM, DABOM [facility] [address] [place], [place] [address] [phone]

## 2024-08-27 NOTE — Patient Instructions (Signed)
 Continue Mounjaro  10 mg weekly  Stay mindful of food choices and portion sizes Ease back into weight training 2-3 x a week Continue walking 1 hr 3 days/ wk  Keep calories ~2000 per day Daily protein goal ~150 g/ day

## 2024-09-23 ENCOUNTER — Other Ambulatory Visit: Payer: Self-pay | Admitting: Internal Medicine

## 2024-09-24 ENCOUNTER — Other Ambulatory Visit: Payer: Self-pay | Admitting: Internal Medicine

## 2024-09-24 ENCOUNTER — Other Ambulatory Visit: Payer: Self-pay

## 2024-09-24 MED ORDER — SCOPOLAMINE 1 MG/3DAYS TD PT72: 1.0000 | MEDICATED_PATCH | TRANSDERMAL | 0 refills | Status: DC

## 2024-09-26 ENCOUNTER — Encounter: Payer: BC Managed Care – PPO | Admitting: Internal Medicine

## 2024-09-30 ENCOUNTER — Other Ambulatory Visit: Payer: Self-pay | Admitting: Internal Medicine

## 2024-10-13 ENCOUNTER — Other Ambulatory Visit: Payer: Self-pay | Admitting: Family Medicine

## 2024-10-13 DIAGNOSIS — K760 Fatty (change of) liver, not elsewhere classified: Secondary | ICD-10-CM

## 2024-10-13 DIAGNOSIS — E119 Type 2 diabetes mellitus without complications: Secondary | ICD-10-CM

## 2024-10-15 ENCOUNTER — Ambulatory Visit: Admitting: Family Medicine

## 2024-10-15 ENCOUNTER — Encounter: Payer: Self-pay | Admitting: Family Medicine

## 2024-10-15 VITALS — BP 126/84 | HR 72 | Temp 98.0°F | Ht 72.0 in | Wt 220.0 lb

## 2024-10-15 DIAGNOSIS — Z7985 Long-term (current) use of injectable non-insulin antidiabetic drugs: Secondary | ICD-10-CM

## 2024-10-15 DIAGNOSIS — Z6829 Body mass index (BMI) 29.0-29.9, adult: Secondary | ICD-10-CM

## 2024-10-15 DIAGNOSIS — E119 Type 2 diabetes mellitus without complications: Secondary | ICD-10-CM | POA: Diagnosis not present

## 2024-10-15 DIAGNOSIS — E663 Overweight: Secondary | ICD-10-CM | POA: Diagnosis not present

## 2024-10-15 DIAGNOSIS — K760 Fatty (change of) liver, not elsewhere classified: Secondary | ICD-10-CM | POA: Diagnosis not present

## 2024-10-15 DIAGNOSIS — G4726 Circadian rhythm sleep disorder, shift work type: Secondary | ICD-10-CM | POA: Diagnosis not present

## 2024-10-15 MED ORDER — TIRZEPATIDE 12.5 MG/0.5ML ~~LOC~~ SOAJ: 12.5000 mg | SUBCUTANEOUS | 1 refills | Status: DC

## 2024-10-15 NOTE — Progress Notes (Signed)
 Office: [phone]  /  Fax: [phone]  WEIGHT SUMMARY AND BIOMETRICS  Starting Date: 12/30/22  Starting Weight: 254lb   Weight Lost Since Last Visit: 0lb   Vitals Temp: 98 F (36.7 C) BP: 126/84 Pulse Rate: 72 SpO2: 99 %   Body Composition  Body Fat %: 27.8 % Fat Mass (lbs): 61.2 lbs Muscle Mass (lbs): 151.4 lbs Total Body Water (lbs): 108.2 lbs Visceral Fat Rating : 12    HPI  Chief Complaint: OBESITY  Brady Wells is here to discuss his progress with his obesity treatment plan. He is on the the Category 4 Plan and states he is following his eating plan approximately 80 % of the time. He states he is exercising 30-60 minutes 3-4 times per week.  Interval History:  Since last office visit he is up 3 lb He did go to [employer] and went on a cruise since his last visit He is still on Mounjaro  10 mg weekly with a bit more hunger He is getting in all of his meals He is still getting in lean protein with meals He is working out 2-3 x a week He still has some back pain and has had to modify his weight training He is working nights which limits his sleep time  Pharmacotherapy: Mounjaro  10 mg weekly  PHYSICAL EXAM:  Blood pressure 126/84, pulse 72, temperature 98 F (36.7 C), height 6' (1.829 m), weight 220 lb (99.8 kg), SpO2 99%. Body mass index is 29.84 kg/m.  General: He is athletic appearing, cooperative, alert, well developed, and in no acute distress. PSYCH: Has normal mood, affect and thought process.   Lungs: Normal breathing effort, no conversational dyspnea.   ASSESSMENT AND PLAN  TREATMENT PLAN FOR OBESITY:  Recommended Dietary Goals  Brady Wells is currently in the action stage of change. As such, his goal is to continue weight management plan. He has agreed to practicing portion control and making smarter food choices, such as increasing vegetables and decreasing simple carbohydrates.  Behavioral Intervention  We discussed the following Behavioral  Modification Strategies today: increasing lean protein intake to established goals, decreasing simple carbohydrates , work on meal planning and preparation, keeping healthy foods at home, identifying sources and decreasing liquid calories, practice mindfulness eating and understand the difference between hunger signals and cravings, work on managing stress, creating time for self-care and relaxation, avoiding temptations and identifying enticing environmental cues, continue to practice mindfulness when eating, and planning for success.  Additional resources provided today: NA  Recommended Physical Activity Goals  Brady Wells has been advised to work up to 150 minutes of moderate intensity aerobic activity a week and strengthening exercises 2-3 times per week for cardiovascular health, weight loss maintenance and preservation of muscle mass.   He has agreed to Increase the intensity, frequency or duration of strengthening exercises  and Increase the intensity, frequency or duration of aerobic exercises   Reviewed exercise change goals on AVS with pt  Pharmacotherapy changes for the treatment of obesity: increase Mounjaro  to 12.5 mg weekly  ASSOCIATED CONDITIONS ADDRESSED TODAY  Type 2 diabetes mellitus without complication, without long-term current use of insulin  (HCC) He has good control of T2DM  Tolerating Mounjaro  well w/o meal skipping or GI upset Repeat insulin  and A1c in 2-3 mos Continue to work on reducing added sugar and starch intake, ramping up walking time Increase Mounjaro  to 12.5 mg weekly -     Tirzepatide ; Inject 12.5 mg into the skin once a week.  Dispense: 2 mL; Refill:  1  NAFLD (nonalcoholic fatty liver disease)  Fibrosis 4 Score = .78 (Low risk)        Interpretation for patients with NAFLD          <1.30       -  F0-F1 (Low risk)          1.30-2.67 -  Indeterminate           >2.67      -  F3-F4 (High risk)     Validated for ages 33-65 Aim for a body fat <25%, VFR <10       Score is based on outdated labs. ALT, AST, and platelets should all be measured within the last 6 months for an accurate FIB-4 Score  Overweight (BMI 25.0-29.9)  BMI 29.0-29.9,adult  Shift work sleep disorder Stable Reviewed exercise time to help with sleep based on sleep/ wake cycles He did improve sleep time to ~7 hrs/ day    He was informed of the importance of frequent follow up visits to maximize his success with intensive lifestyle modifications for his multiple health conditions.   ATTESTASTION STATEMENTS:  Reviewed by clinician on day of visit: allergies, medications, problem list, medical history, surgical history, family history, social history, and previous encounter notes pertinent to obesity diagnosis.   I have personally spent 30 minutes total time today in preparation, patient care, nutritional counseling and education,  and documentation for this visit, including the following: review of most recent clinical lab tests, prescribing medications/ refilling medications, reviewing medical assistant documentation, review and interpretation of bioimpedence results.     Darice Haddock, D.O. DABFM, DABOM [facility] [address] [place], [place] [address] [phone]

## 2024-10-15 NOTE — Patient Instructions (Signed)
 Work on [employer] training schedule at the gym 3 days/ wk Add in a good 60 min walk 2 days/ wk -- try to do on days off

## 2024-10-30 ENCOUNTER — Encounter: Payer: Self-pay | Admitting: Internal Medicine

## 2024-10-30 NOTE — Patient Instructions (Incomplete)
 Health Maintenance, Male  Adopting a healthy lifestyle and getting preventive care are important in promoting health and wellness. Ask your health care provider about:  The right schedule for you to have regular tests and exams.  Things you can do on your own to prevent diseases and keep yourself healthy.  What should I know about diet, weight, and exercise?  Eat a healthy diet    Eat a diet that includes plenty of vegetables, fruits, low-fat dairy products, and lean protein.  Do not eat a lot of foods that are high in solid fats, added sugars, or sodium.  Maintain a healthy weight  Body mass index (BMI) is a measurement that can be used to identify possible weight problems. It estimates body fat based on height and weight. Your health care provider can help determine your BMI and help you achieve or maintain a healthy weight.  Get regular exercise  Get regular exercise. This is one of the most important things you can do for your health. Most adults should:  Exercise for at least 150 minutes each week. The exercise should increase your heart rate and make you sweat (moderate-intensity exercise).  Do strengthening exercises at least twice a week. This is in addition to the moderate-intensity exercise.  Spend less time sitting. Even light physical activity can be beneficial.  Watch cholesterol and blood lipids  Have your blood tested for lipids and cholesterol at 43 years of age, then have this test every 5 years.  You may need to have your cholesterol levels checked more often if:  Your lipid or cholesterol levels are high.  You are older than 43 years of age.  You are at high risk for heart disease.  What should I know about cancer screening?  Many types of cancers can be detected early and may often be prevented. Depending on your health history and family history, you may need to have cancer screening at various ages. This may include screening for:  Colorectal cancer.  Prostate cancer.  Skin cancer.  Lung  cancer.  What should I know about heart disease, diabetes, and high blood pressure?  Blood pressure and heart disease  High blood pressure causes heart disease and increases the risk of stroke. This is more likely to develop in people who have high blood pressure readings or are overweight.  Talk with your health care provider about your target blood pressure readings.  Have your blood pressure checked:  Every 3-5 years if you are 24-52 years of age.  Every year if you are 3 years old or older.  If you are between the ages of 60 and 72 and are a current or former smoker, ask your health care provider if you should have a one-time screening for abdominal aortic aneurysm (AAA).  Diabetes  Have regular diabetes screenings. This checks your fasting blood sugar level. Have the screening done:  Once every three years after age 66 if you are at a normal weight and have a low risk for diabetes.  More often and at a younger age if you are overweight or have a high risk for diabetes.  What should I know about preventing infection?  Hepatitis B  If you have a higher risk for hepatitis B, you should be screened for this virus. Talk with your health care provider to find out if you are at risk for hepatitis B infection.  Hepatitis C  Blood testing is recommended for:  Everyone born from [birth year] through 1965.  Anyone  with known risk factors for hepatitis C.  Sexually transmitted infections (STIs)  You should be screened each year for STIs, including gonorrhea and chlamydia, if:  You are sexually active and are younger than 43 years of age.  You are older than 43 years of age and your health care provider tells you that you are at risk for this type of infection.  Your sexual activity has changed since you were last screened, and you are at increased risk for chlamydia or gonorrhea. Ask your health care provider if you are at risk.  Ask your health care provider about whether you are at high risk for HIV. Your health care provider  may recommend a prescription medicine to help prevent HIV infection. If you choose to take medicine to prevent HIV, you should first get tested for HIV. You should then be tested every 3 months for as long as you are taking the medicine.  Follow these instructions at home:  Alcohol use  Do not drink alcohol if your health care provider tells you not to drink.  If you drink alcohol:  Limit how much you have to 0-2 drinks a day.  Know how much alcohol is in your drink. In the [place] one drink equals one 12 oz bottle of beer (355 mL), one 5 oz glass of wine (148 mL), or one 1 oz glass of hard liquor (44 mL).  Lifestyle  Do not use any products that contain nicotine or tobacco. These products include cigarettes, chewing tobacco, and vaping devices, such as e-cigarettes. If you need help quitting, ask your health care provider.  Do not use street drugs.  Do not share needles.  Ask your health care provider for help if you need support or information about quitting drugs.  General instructions  Schedule regular health, dental, and eye exams.  Stay current with your vaccines.  Tell your health care provider if:  You often feel depressed.  You have ever been abused or do not feel safe at home.  Summary  Adopting a healthy lifestyle and getting preventive care are important in promoting health and wellness.  Follow your health care provider's instructions about healthy diet, exercising, and getting tested or screened for diseases.  Follow your health care provider's instructions on monitoring your cholesterol and blood pressure.  This information is not intended to replace advice given to you by your health care provider. Make sure you discuss any questions you have with your health care provider.  Document Revised: 04/06/2021 Document Reviewed: 04/06/2021  Elsevier Patient Education  2024 [employer].

## 2024-11-20 ENCOUNTER — Other Ambulatory Visit: Payer: Self-pay | Admitting: Internal Medicine

## 2024-11-21 ENCOUNTER — Telehealth: Payer: Self-pay

## 2024-11-23 ENCOUNTER — Encounter: Admitting: Internal Medicine

## 2024-11-26 ENCOUNTER — Ambulatory Visit: Admitting: Family Medicine

## 2024-11-26 ENCOUNTER — Encounter: Payer: Self-pay | Admitting: Family Medicine

## 2024-11-26 VITALS — BP 110/78 | HR 73 | Temp 98.6°F | Ht 72.0 in | Wt 219.0 lb

## 2024-11-26 DIAGNOSIS — K760 Fatty (change of) liver, not elsewhere classified: Secondary | ICD-10-CM

## 2024-11-26 DIAGNOSIS — E119 Type 2 diabetes mellitus without complications: Secondary | ICD-10-CM | POA: Diagnosis not present

## 2024-11-26 DIAGNOSIS — E663 Overweight: Secondary | ICD-10-CM

## 2024-11-26 DIAGNOSIS — Z6829 Body mass index (BMI) 29.0-29.9, adult: Secondary | ICD-10-CM | POA: Diagnosis not present

## 2024-11-26 DIAGNOSIS — G8929 Other chronic pain: Secondary | ICD-10-CM

## 2024-11-26 DIAGNOSIS — M545 Low back pain, unspecified: Secondary | ICD-10-CM

## 2024-11-26 DIAGNOSIS — G4726 Circadian rhythm sleep disorder, shift work type: Secondary | ICD-10-CM | POA: Diagnosis not present

## 2024-11-26 DIAGNOSIS — Z7985 Long-term (current) use of injectable non-insulin antidiabetic drugs: Secondary | ICD-10-CM | POA: Diagnosis not present

## 2024-11-26 MED ORDER — TIRZEPATIDE 12.5 MG/0.5ML ~~LOC~~ SOAJ: 12.5000 mg | SUBCUTANEOUS | 1 refills | Status: AC
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
## 2024-12-17 ENCOUNTER — Encounter: Payer: Self-pay | Admitting: Internal Medicine

## 2024-12-17 NOTE — Patient Instructions (Incomplete)
 Health Maintenance, Male  Adopting a healthy lifestyle and getting preventive care are important in promoting health and wellness. Ask your health care provider about:  The right schedule for you to have regular tests and exams.  Things you can do on your own to prevent diseases and keep yourself healthy.  What should I know about diet, weight, and exercise?  Eat a healthy diet    Eat a diet that includes plenty of vegetables, fruits, low-fat dairy products, and lean protein.  Do not eat a lot of foods that are high in solid fats, added sugars, or sodium.  Maintain a healthy weight  Body mass index (BMI) is a measurement that can be used to identify possible weight problems. It estimates body fat based on height and weight. Your health care provider can help determine your BMI and help you achieve or maintain a healthy weight.  Get regular exercise  Get regular exercise. This is one of the most important things you can do for your health. Most adults should:  Exercise for at least 150 minutes each week. The exercise should increase your heart rate and make you sweat (moderate-intensity exercise).  Do strengthening exercises at least twice a week. This is in addition to the moderate-intensity exercise.  Spend less time sitting. Even light physical activity can be beneficial.  Watch cholesterol and blood lipids  Have your blood tested for lipids and cholesterol at 44 years of age, then have this test every 5 years.  You may need to have your cholesterol levels checked more often if:  Your lipid or cholesterol levels are high.  You are older than 44 years of age.  You are at high risk for heart disease.  What should I know about cancer screening?  Many types of cancers can be detected early and may often be prevented. Depending on your health history and family history, you may need to have cancer screening at various ages. This may include screening for:  Colorectal cancer.  Prostate cancer.  Skin cancer.  Lung  cancer.  What should I know about heart disease, diabetes, and high blood pressure?  Blood pressure and heart disease  High blood pressure causes heart disease and increases the risk of stroke. This is more likely to develop in people who have high blood pressure readings or are overweight.  Talk with your health care provider about your target blood pressure readings.  Have your blood pressure checked:  Every 3-5 years if you are 24-52 years of age.  Every year if you are 3 years old or older.  If you are between the ages of 60 and 72 and are a current or former smoker, ask your health care provider if you should have a one-time screening for abdominal aortic aneurysm (AAA).  Diabetes  Have regular diabetes screenings. This checks your fasting blood sugar level. Have the screening done:  Once every three years after age 66 if you are at a normal weight and have a low risk for diabetes.  More often and at a younger age if you are overweight or have a high risk for diabetes.  What should I know about preventing infection?  Hepatitis B  If you have a higher risk for hepatitis B, you should be screened for this virus. Talk with your health care provider to find out if you are at risk for hepatitis B infection.  Hepatitis C  Blood testing is recommended for:  Everyone born from [birth year] through 1965.  Anyone  with known risk factors for hepatitis C.  Sexually transmitted infections (STIs)  You should be screened each year for STIs, including gonorrhea and chlamydia, if:  You are sexually active and are younger than 44 years of age.  You are older than 44 years of age and your health care provider tells you that you are at risk for this type of infection.  Your sexual activity has changed since you were last screened, and you are at increased risk for chlamydia or gonorrhea. Ask your health care provider if you are at risk.  Ask your health care provider about whether you are at high risk for HIV. Your health care provider  may recommend a prescription medicine to help prevent HIV infection. If you choose to take medicine to prevent HIV, you should first get tested for HIV. You should then be tested every 3 months for as long as you are taking the medicine.  Follow these instructions at home:  Alcohol use  Do not drink alcohol if your health care provider tells you not to drink.  If you drink alcohol:  Limit how much you have to 0-2 drinks a day.  Know how much alcohol is in your drink. In the [place] one drink equals one 12 oz bottle of beer (355 mL), one 5 oz glass of wine (148 mL), or one 1 oz glass of hard liquor (44 mL).  Lifestyle  Do not use any products that contain nicotine or tobacco. These products include cigarettes, chewing tobacco, and vaping devices, such as e-cigarettes. If you need help quitting, ask your health care provider.  Do not use street drugs.  Do not share needles.  Ask your health care provider for help if you need support or information about quitting drugs.  General instructions  Schedule regular health, dental, and eye exams.  Stay current with your vaccines.  Tell your health care provider if:  You often feel depressed.  You have ever been abused or do not feel safe at home.  Summary  Adopting a healthy lifestyle and getting preventive care are important in promoting health and wellness.  Follow your health care provider's instructions about healthy diet, exercising, and getting tested or screened for diseases.  Follow your health care provider's instructions on monitoring your cholesterol and blood pressure.  This information is not intended to replace advice given to you by your health care provider. Make sure you discuss any questions you have with your health care provider.  Document Revised: 04/06/2021 Document Reviewed: 04/06/2021  Elsevier Patient Education  2024 [employer].

## 2024-12-17 NOTE — Progress Notes (Unsigned)
 I,Deleah Tison T Emmitt, CMA,acting as a neurosurgeon for Catheryn LOISE Slocumb, MD.,have documented all relevant documentation on the behalf of Catheryn LOISE Slocumb, MD,as directed by  Catheryn LOISE Slocumb, MD while in the presence of Catheryn LOISE Slocumb, MD.  Subjective:   Patient ID: Brady Wells , male    DOB: [birth year] , 44 y.o.   MRN: [id number]  No chief complaint on file.   HPI  Patient presents today for annual exam. He reports recently having to visit urgent care for further evaluation of severe allergies. He was seen on 09/11/23   He was prescribed both prednisone  and abx. He is now feeling much better.        Past Medical History:  Diagnosis Date   Asthma    Depression      Family History  Problem Relation Age of Onset   Healthy Mother    Healthy Father     Current Medications[1]   Allergies[2]   Men's preventive visit. Patient Health Questionnaire (PHQ-2) is  Flowsheet Row Office Visit from 09/19/2023 in [facility]  PHQ-2 Total Score 0  . Patient is on a *** diet. Marital status: Single. Relevant history for alcohol use is:  Social History   Substance and Sexual Activity  Alcohol Use Yes   Comment: socially  . Relevant history for tobacco use is: Tobacco Use History[3].   Review of Systems  Constitutional: Negative.   Respiratory: Negative.    Gastrointestinal: Negative.   Skin: Negative.   Allergic/Immunologic: Negative.   Hematological: Negative.      There were no vitals filed for this visit. There is no height or weight on file to calculate BMI.  Wt Readings from Last 3 Encounters:  11/26/24 219 lb (99.3 kg)  10/15/24 220 lb (99.8 kg)  08/27/24 217 lb (98.4 kg)    Objective:  Physical Exam      Assessment And Plan:    Encounter for annual health examination  Mild intermittent asthma without complication  NAFLD (nonalcoholic fatty liver disease)     No follow-ups on file. Patient was given opportunity to ask  questions. Patient verbalized understanding of the plan and was able to repeat key elements of the plan. All questions were answered to their satisfaction.   Catheryn LOISE Slocumb, MD  I, Catheryn LOISE Slocumb, MD, have reviewed all documentation for this visit. The documentation on 12/17/24 for the exam, diagnosis, procedures, and orders are all accurate and complete.     [1]  Current Outpatient Medications:    albuterol  (PROVENTIL ) (2.5 MG/3ML) 0.083% nebulizer solution, TAKE 6 MLS (5 MG TOTAL) BY NEBULIZATION EVERY 6 (SIX) HOURS AS NEEDED FOR WHEEZING OR SHORTNESS OF BREATH. USING WITH DAUGHTER (HER MEDICINE AND NEBULIZER). LAST USED: 09-08-2023, Disp: 225 mL, Rfl: 2   albuterol  (VENTOLIN  HFA) 108 (90 Base) MCG/ACT inhaler, INHALE 2 PUFFS BY MOUTH EVERY 4 HOURS AS NEEDED FOR WHEEZE OR FOR SHORTNESS OF BREATH, Disp: 20.1 each, Rfl: 2   azelastine (OPTIVAR) 0.05 % ophthalmic solution, Place 2 drops into both eyes daily., Disp: , Rfl:    BD PEN NEEDLE NANO 2ND GEN 32G X 4 MM MISC, , Disp: , Rfl:    cyclobenzaprine  (FLEXERIL ) 10 MG tablet, Take 1 tablet (10 mg total) by mouth 2 (two) times daily as needed for muscle spasms., Disp: 20 tablet, Rfl: 0   desonide  (DESOWEN ) 0.05 % lotion, APPLY TOPICALLY 2 TIMES DAILY AS NEEDED., Disp: 59 mL, Rfl: 0   diazepam (VALIUM)  10 MG tablet, Take 10 mg by mouth daily. (Patient not taking: Reported on 03/26/2024), Disp: , Rfl:    EPINEPHRINE  0.3 mg/0.3 mL IJ SOAJ injection, INJECT 0.3 MG INTO THE MUSCLE AS NEEDED., Disp: 6 each, Rfl: 1   finasteride (PROPECIA) 1 MG tablet, Take 1 tablet by mouth daily., Disp: , Rfl:    Fluocinolone Acetonide Scalp 0.01 % OIL, Apply 1 Application topically as directed. Topical 2-3 Times Weekly, Disp: , Rfl:    fluticasone  (FLONASE ) 50 MCG/ACT nasal spray, Place 1 spray into both nostrils daily for 3 days., Disp: 16 g, Rfl: 0   fluticasone -salmeterol (ADVAIR DISKUS) 100-50 MCG/ACT AEPB, Inhale 1 puff into the lungs 2 (two) times daily. Last  used: 09-07-2023, Disp: 3 each, Rfl: 3   gabapentin (NEURONTIN) 100 MG capsule, Take 100 mg by mouth 3 (three) times daily., Disp: , Rfl:    ibuprofen  (ADVIL ) 600 MG tablet, Take 1 tablet (600 mg total) by mouth every 6 (six) hours as needed., Disp: 30 tablet, Rfl: 0   ketoconazole (NIZORAL) 2 % shampoo, Apply 1 Application topically 2 (two) times a week., Disp: , Rfl:    levocetirizine (XYZAL ) 5 MG tablet, TAKE 1 TABLET BY MOUTH EVERY DAY IN THE EVENING, Disp: 90 tablet, Rfl: 2   minoxidil (LONITEN) 2.5 MG tablet, Take 2.5 mg by mouth daily., Disp: , Rfl:    montelukast (SINGULAIR) 10 MG tablet, Take 10 mg by mouth at bedtime., Disp: , Rfl:    Multiple Vitamin (MULTIVITAMIN ADULT PO), Take by mouth., Disp: , Rfl:    Multiple Vitamin (MULTIVITAMIN WITH MINERALS) TABS, Take 1 tablet by mouth daily., Disp: , Rfl:    ondansetron  (ZOFRAN -ODT) 4 MG disintegrating tablet, Take 1 tablet (4 mg total) by mouth every 8 (eight) hours as needed for nausea or vomiting., Disp: 20 tablet, Rfl: 0   scopolamine  (TRANSDERM-SCOP) 1 MG/3DAYS, PLACE 1 PATCH (1 MG TOTAL) ONTO THE SKIN EVERY 3 (THREE) DAYS., Disp: 90 patch, Rfl: 1   tirzepatide  (MOUNJARO ) 12.5 MG/0.5ML Pen, Inject 12.5 mg into the skin once a week., Disp: 2 mL, Rfl: 1   WIXELA INHUB 100-50 MCG/ACT AEPB, INHALE 1 PUFF INTO THE LUNGS 2 (TWO) TIMES DAILY. LAST USED: 09-07-2023, Disp: 180 each, Rfl: 0 [2]  Allergies Allergen Reactions   Bee Venom Swelling  [3]  Social History Tobacco Use  Smoking Status Never   Passive exposure: Never  Smokeless Tobacco Never

## 2025-01-07 ENCOUNTER — Ambulatory Visit: Admitting: Family Medicine

## 2025-02-04 ENCOUNTER — Encounter: Payer: Self-pay | Admitting: Internal Medicine
